# Patient Record
Sex: Male | Born: 1960 | Race: White | Hispanic: No | Marital: Married | Smoking: Never smoker
Health system: Southern US, Community
[De-identification: ages and names within clinical notes are randomized; demographics above are authoritative.]

## PROBLEM LIST (undated history)

## (undated) DIAGNOSIS — T8859XA Other complications of anesthesia, initial encounter: Secondary | ICD-10-CM

## (undated) DIAGNOSIS — R7303 Prediabetes: Secondary | ICD-10-CM

---
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
## 1999-03-18 HISTORY — PX: APPENDECTOMY: SHX54

## 2008-03-15 ENCOUNTER — Encounter: Admission: RE | Admit: 2008-03-15 | Discharge: 2008-03-15 | Payer: Self-pay | Admitting: Orthopedic Surgery

## 2008-06-01 ENCOUNTER — Encounter: Admission: RE | Admit: 2008-06-01 | Discharge: 2008-06-22 | Payer: Self-pay | Admitting: Orthopedic Surgery

## 2009-03-17 DIAGNOSIS — M1990 Unspecified osteoarthritis, unspecified site: Secondary | ICD-10-CM

## 2009-03-17 HISTORY — DX: Unspecified osteoarthritis, unspecified site: M19.90

## 2015-07-23 DIAGNOSIS — I252 Old myocardial infarction: Secondary | ICD-10-CM

## 2016-06-30 ENCOUNTER — Inpatient Hospital Stay: Payer: PRIVATE HEALTH INSURANCE

## 2016-06-30 ENCOUNTER — Ambulatory Visit: Payer: PRIVATE HEALTH INSURANCE

## 2016-06-30 DIAGNOSIS — M5441 Lumbago with sciatica, right side: Secondary | ICD-10-CM

## 2016-06-30 DIAGNOSIS — M629 Disorder of muscle, unspecified: Secondary | ICD-10-CM

## 2016-06-30 MED ORDER — CYCLOBENZAPRINE HCL 5 MG PO TABS
5 mg | ORAL_TABLET | Freq: Every evening | ORAL | 0 refills | Status: AC | PRN
Start: 2016-06-30 — End: ?

## 2016-06-30 MED ORDER — NAPROXEN 500 MG PO TABS
500 mg | ORAL_TABLET | Freq: Two times a day (BID) | ORAL | 2 refills | Status: AC
Start: 2016-06-30 — End: ?

## 2016-07-03 ENCOUNTER — Ambulatory Visit: Payer: PRIVATE HEALTH INSURANCE

## 2016-07-03 ENCOUNTER — Telehealth: Payer: BLUE CROSS/BLUE SHIELD

## 2017-08-18 ENCOUNTER — Ambulatory Visit: Payer: BLUE CROSS/BLUE SHIELD

## 2017-08-18 DIAGNOSIS — Z7689 Persons encountering health services in other specified circumstances: Secondary | ICD-10-CM

## 2017-08-18 DIAGNOSIS — M7520 Bicipital tendinitis, unspecified shoulder: Secondary | ICD-10-CM

## 2017-08-18 DIAGNOSIS — I10 Essential (primary) hypertension: Secondary | ICD-10-CM

## 2017-08-18 NOTE — Patient Instructions
Understanding Biceps Tendonitis    A tendon is a strong band of tissue that connects muscle to bone. The biceps muscle is in the front of the upper arm. It helps with movements such as bending the elbow or raising the arm. The upper end of the biceps muscle is called the proximal end. It has two tendons called the long head and the short head. These tendons attach the muscle to the bones in the shoulder. Biceps tendonitis occurs when either of these tendons is irritated or red and swollen (inflamed). Most cases involve the long head.  Causes of biceps tendonitis  Causes can include:  ??? Wear and tear of the tendon from aging or normal use over time  ??? Overuse of the tendon from sports or work activities, especially those that involve repeated overhead movements  ??? Injury to the tendon from a fall or other accident  ??? Other problems in the shoulder, such as shoulder impingement or a rotator cuff tear  Symptoms of biceps tendonitis  Common symptoms include:  ??? Pain in the front of the shoulder that may also travel down the arm. The pain may be worse with activity and at night.  ??? Swelling in the shoulder  ??? Clicking or catching sensation when using the arm and shoulder  ??? Trouble moving the arm and shoulder  Treating biceps tendonitis  Treatment for biceps tendonitis may include:  ??? Resting the arm and shoulder. This involves limiting certain movements, such as reaching above the head or raising the arm. These can slow healing and make symptoms worse. You may also need to limit certain sports and types of work for a time.  ??? Cold therapy. This involves using items such as ice packs to help relieve symptoms. Cold can help reduce pain and swelling.  ??? Medicines. These help relieve pain and swelling. NSAIDs (nonsteroidal anti-inflammatory drugs) are the most common medicines used. Medicines may be prescribed or bought over-the-counter. They may be given as pills. Or they may be applied to the skin in the form of a gel, cream, or patch.  ??? Injections of medicine into the injured area. These help relieve pain and swelling for a time.  ??? Physical therapy and exercises. ???These help improve strength and range of motion in the arm and shoulder.  Possible complications  ??? If the tendon isn???t given time to heal, symptoms may worsen. Also, the tendon may tear (rupture).  ??? If the tendon ruptures or doesn???t get better with treatment, your healthcare provider may recommend surgery. This most often involves repairing and reattaching the tendon.  ???  When to call your healthcare provider  Call your healthcare provider right away if you have any of these:  ??? Fever of 100.4???F (38???C) or higher, or as directed  ??? Symptoms that don???t get better with treatment, or get worse  ??? Weakness or instability in the arm or shoulder  ??? Sudden sharp pain, bruising, swelling, popping or snapping sensation, or bulge in the upper arm or shoulder  ??? New symptoms   Date Last Reviewed: 05/25/2014  ??? 2000-2018 [employer], [place]. [address] [place], [place] [address]. All rights reserved. This information is not intended as a substitute for professional medical care. Always follow your healthcare professional's instructions.        Shoulder Abduction (Flexibility)    1. Stand up straight. Or lie on your back on the floor with legs straight. Hold a broomstick horizontally. Cup the top of the  broomstick with your right hand. Hold the broomstick just above the broom with your left hand, palm facing down.  2. Push the broomstick to the right with your left hand, raising your right arm up. Raise it only as high as feels comfortable.  3. Hold for a few seconds. Return to the starting position.  4. Repeat???3 to 5???times, or as instructed. Build up to holding each stretch for???10 to 30 seconds.  ???  Tip: If you don???t have a broom, you can also do this exercise with a cane, mop, or yardstick.   ??? Date Last Reviewed: 05/25/2014  ??? 2000-2018 [employer], [place]. [address] [place], [place] [address]. All rights reserved. This information is not intended as a substitute for professional medical care. Always follow your healthcare professional's instructions.        Shoulder Flexion (Flexibility)    5. Sit in a chair sideways next to a table. Lay your right arm on the table, pointed forward.  6. Slowly lean forward.??? Slide your arm forward on the table. Feel the stretch in your right shoulder.  7. Hold for 5 seconds. Slowly sit back up.  8. Repeat 5 times.  9. Repeat this exercise 3 times a day, or as instructed.  Date Last Reviewed: 05/25/2014  ??? 2000-2018 [employer], [place]. [address], [place], [place] [address]. All rights reserved. This information is not intended as a substitute for professional medical care. Always follow your healthcare professional's instructions.

## 2017-08-18 NOTE — Progress Notes
PATIENT: Kyle David  MRN: [id number]  DOB: 12-16-1960  DATE OF SERVICE: 08/18/2017    CHIEF COMPLAINT:   Chief Complaint   Patient presents with   ??? Establish Care        Subjective:      Kyle David is a 57 y.o. male who presents to clinic to establish care. He reports he is doing well, but at his last cardiologist appointment he was noted to have elevated BP. Blood work was drawn with plan to follow-up in 3 days. He presents today to establish care for his other medical issues. He also notes that he has been having bilateral shoulder pain, and would like to have them evaluated.     Review of Systems:   Review of Systems   Constitutional: Negative for chills, fever and malaise/fatigue.   HENT: Negative for congestion and sore throat.    Eyes: Negative for blurred vision and double vision.   Respiratory: Negative for cough and shortness of breath.    Cardiovascular: Negative for chest pain and palpitations.   Gastrointestinal: Negative for constipation, diarrhea, nausea and vomiting.   Musculoskeletal: Positive for joint pain.   Neurological: Negative for dizziness and weakness.   Psychiatric/Behavioral: Negative.    All other systems reviewed and are negative.       Objective:      BP 159/75  ~ Pulse 63  ~ Temp 36.6 ???C (97.8 ???F) (Oral)  ~ Wt 195 lb 12.8 oz (88.8 kg)  ~ BMI 26.23 kg/m???     Physical Exam   Constitutional: He is oriented to person, place, and time. He appears well-developed and well-nourished.   HENT:   Head: Normocephalic and atraumatic.   Eyes: Pupils are equal, round, and reactive to light. Conjunctivae and EOM are normal.   Neck: Normal range of motion.   Musculoskeletal: Normal range of motion.   TTP over biceps groove bilaterally. Full AROM of bilateral shoulders.    Neurological: He is alert and oriented to person, place, and time.   Psychiatric: He has a normal mood and affect. His behavior is normal. Judgment and thought content normal.   Nursing note and vitals reviewed. Assessment & Plan:        1. Encounter to establish care  - Patient doing well  - Outside records requested  - Patient to f/u for annual exam    2. Benign essential HTN  - BP elevated today  - Patient being followed by outside cardiologist with blood work drawn  - Will defer to cardiology on medication modification at this time    3. Biceps tendinitis, unspecified laterality  - Symptoms consistent with bilateral tendinitis  - Recommend symptomatic therapy with NSAIDs, heat, topical menthol and stretching exercises  - May consider return to PT or injection if symptoms persist    No orders of the defined types were placed in this encounter.    Return if symptoms worsen or fail to improve.    The above diagnosis, orders, and follow-up were discussed with the patient. The patient had all questions answered satisfactorily and understands this recommended plan of care.    Other information for this visit that was reviewed include but was not limited to:    No Known Allergies  Social History   Substance Use Topics   ??? Smoking status: Never Smoker   ??? Smokeless tobacco: Never Used   ??? Alcohol use No           Author:  Osborne Oman  08/18/2017 4:17 PM

## 2017-09-04 ENCOUNTER — Ambulatory Visit: Payer: PRIVATE HEALTH INSURANCE

## 2017-09-04 ENCOUNTER — Telehealth: Payer: PRIVATE HEALTH INSURANCE

## 2017-09-04 DIAGNOSIS — Z0000 Encounter for general adult medical examination without abnormal findings: Secondary | ICD-10-CM

## 2017-09-08 ENCOUNTER — Institutional Professional Consult (permissible substitution): Payer: BLUE CROSS/BLUE SHIELD

## 2017-09-08 DIAGNOSIS — Z0000 Encounter for general adult medical examination without abnormal findings: Secondary | ICD-10-CM

## 2017-09-08 LAB — Lipid Panel: TRIGLYCERIDES: 67 mg/dL (ref <150)

## 2017-09-08 LAB — Comprehensive Metabolic Panel
POTASSIUM: 5.5 mmol/L — ABNORMAL HIGH (ref 3.6–5.3)
UREA NITROGEN: 15 mg/dL (ref 7–22)

## 2017-09-08 LAB — CBC: NUCLEATED RBC%, AUTOMATED: 0 (ref 36.9–48.3)

## 2017-09-08 LAB — Differential Automated: NEUTROPHIL PERCENT, AUTO: 73.1 (ref 1.80–6.90)

## 2017-09-09 LAB — Hgb A1c: HGB A1C - HPLC: 6.8 — ABNORMAL HIGH (ref <5.7)

## 2017-09-09 LAB — Vitamin D,25-Hydroxy: VITAMIN D,25-HYDROXY: 30 ng/mL (ref 20–50)

## 2017-09-10 ENCOUNTER — Telehealth: Payer: PRIVATE HEALTH INSURANCE

## 2017-09-10 DIAGNOSIS — E119 Type 2 diabetes mellitus without complications: Secondary | ICD-10-CM

## 2017-09-10 MED ORDER — METFORMIN HCL 500 MG PO TABS
ORAL_TABLET | 1 refills | Status: AC
Start: 2017-09-10 — End: ?

## 2017-09-10 NOTE — Telephone Encounter
Left voicemail for patient regarding increased A1c. Will increase metformin to 1000mg  BID (if taking 500mg  BID), or restart prior prescription.

## 2017-09-11 ENCOUNTER — Ambulatory Visit: Payer: BLUE CROSS/BLUE SHIELD

## 2017-09-22 ENCOUNTER — Ambulatory Visit

## 2017-09-22 DIAGNOSIS — N401 Enlarged prostate with lower urinary tract symptoms: Secondary | ICD-10-CM

## 2017-09-22 DIAGNOSIS — E119 Type 2 diabetes mellitus without complications: Secondary | ICD-10-CM

## 2017-09-22 DIAGNOSIS — Z0000 Encounter for general adult medical examination without abnormal findings: Secondary | ICD-10-CM

## 2017-09-22 DIAGNOSIS — I10 Essential (primary) hypertension: Secondary | ICD-10-CM

## 2017-09-22 DIAGNOSIS — R351 Nocturia: Secondary | ICD-10-CM

## 2017-09-22 NOTE — Patient Instructions
Prevention Guidelines, Men Ages 50 to 64  Screening tests and vaccines are an important part of managing your health. A screening test is done to find possible disorders or diseases in people who don't have any symptoms. The goal is to find a disease early so lifestyle changes can be made and you can be watched more closely to reduce the risk of disease, or to detect it early enough to treat it most effectively. Screening tests are not considered diagnostic, but are used to determine if more testing is needed. Health counseling is essential, too. Below are guidelines for these, for men ages 50 to 64. Talk with your healthcare provider to make sure you're up-to-date on what you need.  Screening Who needs it How often   Alcohol misuse All men in this age group At routine exams   Blood pressure All men in this age group Yearly checkup if your blood pressure is normal  Normal blood pressure is less than 120/80 mm Hg  If your blood pressure reading is higher than normal, follow the advice of your healthcare provider     Colorectal cancer All men in this age group Flexible sigmoidoscopy every 5 years, or colonoscopy every 10 years, or double-contrast barium enema every 5 years; yearly fecal occult blood test or fecal immunochemical test; or a stool DNA test as often as your healthcare provider advises; talk with your healthcare provider about which tests are best for you   Depression All men in this age group At routine exams   Type 2 diabetes or prediabetes All men beginning at age 45 and men without symptoms at any age who are overweight or obese and have 1 or more other risk factors for diabetes At least every 3 years (yearly if your blood sugar has already begun to rise)   Type 2 diabetes All men with prediabetes Every year   Hepatitis C Men at increased risk for infection - talk with your healthcare provider At routine exams. All men ages 50 to 70 should be tested at least once for hepatitis C.   High cholesterol  or triglycerides All men in this age group At least every 5 years   HIV Men at increased risk for infection - talk with your healthcare provider At routine exams   Lung cancer Adults age 55 to 80 who have smoked Yearly screening in smokers with 30 pack-year history of smoking or who quit within 15 years   Obesity All men in this age group At routine exams   Prostate cancer Starting at age 45, talk to healthcare provider about risks and benefits of digital rectal exam (DRE) and prostate-specific antigen (PSA) screening1 At routine exams   Syphilis Men at increased risk for infection - talk with your healthcare provider At routine exams   Tuberculosis Men at increased risk for infection - talk with your healthcare provider Ask your healthcare provider   Vision All men in this age group Ask your healthcare provider   Vaccine Who needs it How often   Chickenpox (varicella) All men in this age group who have no record of this infection or vaccine 2 doses; second dose should be given at least 4 weeks after the first dose   Hepatitis A Men at increased risk for infection - talk with your healthcare provider 2 doses given at least 6 months apart   Hepatitis B Men at increased risk for infection - talk with your healthcare provider 3 doses over 6 months; second dose   should be given 1 month after the first dose; the third dose should be given at least 2 months after the second dose and at least 4 months after the first dose   Haemophilus influenzaeType B (HIB) Men at increased risk for infection - talk with your healthcare provider 1 to 3 doses   Influenza (flu) All men in this age group Once a year   Measles, mumps, rubella (MMR) Men in this age group through their late 50s who have no record of these infections or vaccines 1 or 2 doses; ask your healthcare provider   Meningococcal Men at increased risk for infection - talk with your healthcare provider 1 or more doses   Pneumococcal conjugate vaccine (PCV13)and  pneumococcal polysaccharidevaccine(PPSV23) Men at increased risk for infection - talk with your healthcare provider PCV13: 1 dose ages 19 to 65 (protects against 13 types of pneumococcal bacteria)    PPSV23: 1 to2 doses through age 64, or 1 dose at 65 or older (protects against 23 types of pneumococcal bacteria)     Tetanus/diphtheria/  pertussis (Td/Tdap) booster All men in this age group Td every 10 years, or a one-time dose of Tdap instead of a Td booster after age 18, then Td every 10 years   Zoster All men ages 60 and older 1 dose   Counseling Who needs it How often   Diet and exercise Men who are overweight or obese When diagnosed, and then at routine exams   Sexually transmitted infection prevention Men at increased risk for infection - talk with your healthcare provider At routine exams   Use of daily aspirin Men in this age group at risk for cardiovascular health problems At routine exams   Use of tobacco and the health effects it can cause All men in this age group Every visit   1[employer]  Date Last Reviewed: 04/18/2015   2000-2018 [employer] [address], [place] [address]. All rights reserved. This information is not intended as a substitute for professional medical care. Always follow your healthcare professional's instructions.

## 2017-09-22 NOTE — Assessment & Plan Note
THIS REPORT IS AUTOMATICALLY GENERATED BY THE [facility].     ADULT ASSESSMENT FEEDBACK REPORT    -------------------------    DEMOGRAPHICS    *Last 5 MRN# [id number]  *Gender: M  *Age: [age]    -------------------------    Generalized Anxiety Disorder 7-item (GAD-7)  Score: GREEN (0) Individual is not currently experiencing anxiety. Highlight positive coping and encourage continued use of resiliency skills.    Patient Health Questionnaire 9-item (PHQ-9)  Score: GREEN (0) Individual does not exhibit emotional or functional problems indicative of depression. Current use of resilient behaviors should be identified and supported.    Alcohol Use Disorders Identification Test (AUDIT-C)  Score: None.    GAD-7 Items  *only listed items scored 1 or higher (1 - Several days, 2 - More than half the days and 3 - Nearly every day)    PHQ-9 Items  *only listed items scored 1 or higher (1 - Several days, 2 - More than half the days and 3 - Nearly every day)    AUDIT-C Items  1. How often do you have a drink containing alcohol? -- Never (0)    Total Scores (Text Only):  GAD-7 = 0  PHQ-9 = 0  AUDIT-C = 0    --- END ---    [facility] Check[facility]Up Report generated on 2017-09-22

## 2017-09-23 LAB — Albumin/Creatinine Ratio,Urine: ALBUMIN/CREATININE RATIO: <12 ug/mg (ref <30.0)

## 2017-09-23 NOTE — H&P
PATIENT: Kyle David  MRN: [id number]  DOB: Sep 02, 1960  DATE OF SERVICE: 09/22/2017    PRIMARY CARE PROVIDER: Osborne Oman., MD    CHIEF COMPLAINT:   Chief Complaint   Patient presents with   ??? Annual Exam        Subjective:      Kyle David is a 57 y.o. male who presents to clinic for his annual exam. Patient reports he is doing well, without any concerns. He is working on returning to his diet and has already lost 7#.    Past Medical History:   Diagnosis Date   ??? Concussion     age 32 yrs, skill fx    ??? Hyperlipidemia    ??? Hypertension    ??? Myocardial infarction (HCC/RAF) 2009     History reviewed. No pertinent surgical history.  Family History   Problem Relation Age of Onset   ??? Adopted: Yes     Social History     Social History   ??? Marital status: Single     Spouse name: N/A   ??? Number of children: N/A   ??? Years of education: N/A     Occupational History   ??? Not on file.     Social History Main Topics   ??? Smoking status: Never Smoker   ??? Smokeless tobacco: Never Used   ??? Alcohol use No   ??? Drug use: No   ??? Sexual activity: No     Other Topics Concern   ??? Do You Exercise At Least A Day, 3 Or More Days A Week? Yes   ??? Types Of Exercise? (List In Comments) Yes   ??? Do You Follow A Special Diet? Yes   ??? Vegan? No   ??? Vegetarian? No   ??? Pescatarian? No   ??? Lactose Free? No   ??? Gluten Free? No   ??? Omnivore? No     Social History Narrative   ??? No narrative on file     Outpatient Medications Prior to Visit   Medication Sig   ??? amlodipine 10 mg tablet    ??? aspirin 81 mg EC tablet Take 81 mg by mouth daily.   ??? atorvastatin 40 mg tablet    ??? lisinopril 5 mg tablet    ??? azelastine 0.05% ophthalmic solution Place 1 drop into the right eye two (2) times daily. (Patient not taking: Reported on 08/18/2017.)   ??? ciclopirox 8% solution Apply topically at bedtime Apply over nail nightly. (Patient not taking: Reported on 08/18/2017.)   ??? cyclobenzaprine 5 mg tablet Take 1 tablet (5 mg total) by mouth at bedtime as needed for Muscle spasms. (Patient not taking: Reported on 08/18/2017.)   ??? metFORMIN 500 mg tablet TAKE 1 TABLET TWICE A DAY WITH MEALS.     No facility-administered medications prior to visit.      No Known Allergies    Review of Systems:   A 14-system review of systems was performed and is negative except as stated in the history of present illness.     Objective:        Vitals:  BP 143/80  ~ Pulse 57  ~ Temp 36.9 ???C (98.4 ???F) (Oral)  ~ Wt 186 lb (84.4 kg)  ~ SpO2 95%  ~ BMI 24.92 kg/m???    General:   alert, appears stated age and cooperative   Skin:   Skin color, texture, turgor normal. No rashes or lesions   Head:   Normocephalic,  without obvious abnormality, atraumatic   Eyes:   conjunctivae/corneas clear. PERRL, EOM's intact. Fundi benign.   Ears:   normal TM's and external ear canals both ears   Nose:  Nares normal. Septum midline. Mucosa normal. No drainage or sinus tenderness.   Throat:  lips, mucosa, and tongue normal; teeth and gums normal   Mouth:   No perioral or gingival cyanosis or lesions.  Tongue is normal in appearance.   Neck:  no adenopathy, no carotid bruit, no JVD, supple, symmetrical, trachea midline and thyroid not enlarged, symmetric, no tenderness/mass/nodules   Back:  symmetric, no curvature. ROM normal. No CVA tenderness.   Lungs:   clear to auscultation bilaterally   Heart:   regular rate and rhythm, S1, S2 normal, no murmur, click, rub or gallop   Abdomen:   soft, non-tender; bowel sounds normal; no masses,  no organomegaly   GU:   patient refused rectal exam   Musculoskeletal:  negative   Extremities:  extremities normal, atraumatic, no cyanosis or edema   Pulses:       2+ and symmetric   Lymph Nodes:  cervical, supraclavicular, and axillary nodes normal.   Neurologic:   Alert and oriented X 3, normal strength and tone. Normal symmetric reflexes. Normal coordination and gait   Psychiatric:   oriented to time, place and person, mood and affect are within normal limits Lab Review:   Clinical Support on 09/08/2017   Component Date Value   ??? Sodium 09/08/2017 143    ??? Potassium 09/08/2017 5.5*   ??? Chloride 09/08/2017 106    ??? Total CO2 09/08/2017 24    ??? Anion Gap 09/08/2017 13    ??? Glucose 09/08/2017 153*   ??? GFR Estimate for Non-Afr* 09/08/2017 84    ??? GFR Estimate for African* 09/08/2017 >89    ??? GFR Additional Informati* 09/08/2017 See Comment    ??? Creatinine 09/08/2017 1.00    ??? Urea Nitrogen 09/08/2017 15    ??? Calcium 09/08/2017 10.0    ??? Total Protein 09/08/2017 6.9    ??? Albumin 09/08/2017 4.5    ??? Bilirubin,Total 09/08/2017 0.5    ??? Alkaline Phosphatase 09/08/2017 88    ??? Aspartate Aminotransfera* 09/08/2017 23    ??? Alanine Aminotransferase 09/08/2017 19    ??? Cholesterol 09/08/2017 116    ??? Cholesterol,LDL,Calc 09/08/2017 67    ??? Cholesterol, HDL 09/08/2017 36*   ??? Triglycerides 09/08/2017 67    ??? Non-HDL,Chol,Calc 09/08/2017 80    ??? Hgb A1c - HPLC 09/08/2017 6.8*   ??? Vitamin D,25-Hydroxy 09/08/2017 30    ??? White Blood Cell Count 09/08/2017 7.59    ??? Red Blood Cell Count 09/08/2017 5.31    ??? Hemoglobin 09/08/2017 16.5    ??? Hematocrit 09/08/2017 48.8    ??? Mean Corpuscular Volume 09/08/2017 91.9    ??? Mean Corpuscular Hemoglo* 09/08/2017 31.1    ??? MCH Concentration 09/08/2017 33.8    ??? Red Cell Distribution Wi* 09/08/2017 42.1    ??? Red Cell Distribution Wi* 09/08/2017 12.5    ??? Platelet Count, Auto 09/08/2017 298    ??? Mean Platelet Volume 09/08/2017 10.7    ??? Nucleated RBC%, automated 09/08/2017 0.0    ??? Absolute Nucleated RBC C* 09/08/2017 0.00    ??? Neutrophil Abs (Prelim) 09/08/2017 5.55    ??? Neutrophil Percent, Auto 09/08/2017 73.1    ??? Lymphocyte Percent, Auto 09/08/2017 14.4    ??? Monocyte Percent, Auto 09/08/2017 7.0    ??? Eosinophil  Percent, Auto 09/08/2017 4.7    ??? Basophil Percent, Auto 09/08/2017 0.7    ??? Immature Granulocytes% 09/08/2017 0.1    ??? Absolute Neut Count 09/08/2017 5.55    ??? Absolute Lymphocyte Count 09/08/2017 1.09* ??? Absolute Mono Count 09/08/2017 0.53    ??? Absolute Eos Count 09/08/2017 0.36    ??? Absolute Baso Count 09/08/2017 0.05    ??? Absolute Immature Gran C* 09/08/2017 0.01         Evaluation of Cognitive Function:  Mood/affect: Oriented to time, place, person.   Judgement intact.   No evidence of severe depression or agitation.      Depression Screening:  PHQ-9 Results  Depression Screening (Patient Health Questionnaire PHQ) 08/20/2015 06/03/2016 08/18/2017 09/22/2017   PHQ-2: Feeling down, depressed, or hopeless No No No -   PHQ-2: Little interest or pleassure in doing things No No No -   Little interest or pleasure in doing things - - - Not at all   Feeling down, depressed, or hopeless - - - Not at all   Trouble falling or staying asleep, or sleeping too much - - - Not at all   Feeling tired or having little energy - - - Not at all   Poor appetite or overeating - - - Not at all   Feeling bad about yourself - or that you are a failure or have let yourself or your family down - - - Not at all   Trouble concentrating on things, such as reading the newspaper or watching television - - - Not at all   Moving or speaking so slowly Or being so fidgety or restless - - - Not at all   Thoughts that you would be better off dead, or of hurting yourself in some way - - - Not at all   PHQ-9 Total Score - - - 0       GAD-7 Results  GAD-7 09/22/2017   Feeling nervous, anxious, or on edge 0-Not at all   Not being able to stop or control worrying 0-Not at all   Worrying too much about different things 0-Not at all   Trouble relaxing 0-Not at all   Being so restless that is hard to sit still 0-Not at all   Becoming easily annoyed or irritable 0-Not at all   Feeling afraid, as if something awful might happen 0-Not at all   Total score 0       DAST Results  DAST-10 09/22/2017   Have you used drugs other than those required for medical reasons N/A   Do you abuse more than one drug at a time? N/A Are you always able to stop using drugs when you want to? N/A   Have you had ???blackouts??? or ???flashbacks??? as a result of drug use? N/A   Do you ever feel bad or guilty about your drug use? N/A   Does your spouse (or parent) ever complain about your involvement with drugs? N/A   Have you neglected your family because of your use of drugs? N/A   Have you engaged in illegal activities in order to obtain drugs? N/A   Have you ever experienced withdrawal symptoms (felt sick) when you stopped taking drugs? N/A   Have you had medical problems as a result of your drug use (e.g., memory loss, hepatitis, convulsions, bleeding etc???)? N/A   Total Score 0       Audit-C results  AUDIT-C 09/22/2017   How often do you have  a drink containing alcohol? Never   How many standard drink containing alcohol do you have on a typical day? N/A   How often do you have six or more drinks on one occasion? N/A   Total Score 0          Assessment & Plan:       1. Routine general medical examination at a health care facility  - Patient doing well without any concerns  - Basic laboratories ordered    2. Type 2 diabetes mellitus without complication, without long-term current use of insulin (HCC/RAF)  - Patient desires to treat with diet  - Will continue to monitor  - Repeat A1c in 2 months  - Patient to f/u with outpatient opthalmology for retinal exam  - Monofilament testing negative in clinic  - Urine test ordered    3. Essential hypertension  - BP elevated today  - Patient trying to control with diet  - Will continue to monitor    4. Benign prostatic hyperplasia with nocturia  - Patient declines intervention today  - Will monitor      Follow up: 1 year       Advice/Referrals:   Subsequent annual wellness visit--at least 12 months since last annual wellness visit.       The additional diagnoses assessed at this visit constituted a separate, identifiable service from the routine preventive health examination. The above diagnosis, orders, and follow-up were discussed with the patient. The patient had all questions answered satisfactorily and understands this recommended plan of care.    Author:  Osborne Oman 09/22/2017 11:02 PM

## 2019-05-07 ENCOUNTER — Ambulatory Visit: Payer: BLUE CROSS/BLUE SHIELD

## 2019-05-07 DIAGNOSIS — M62838 Other muscle spasm: Secondary | ICD-10-CM

## 2019-05-07 MED ORDER — IBUPROFEN 600 MG PO TABS
600 mg | ORAL_TABLET | Freq: Four times a day (QID) | ORAL | 2 refills | Status: AC | PRN
Start: 2019-05-07 — End: ?

## 2019-05-07 MED ORDER — METHOCARBAMOL 750 MG PO TABS
750 mg | ORAL_TABLET | Freq: Four times a day (QID) | ORAL | 0 refills | 17.00000 days | Status: AC
Start: 2019-05-07 — End: ?

## 2019-05-07 NOTE — Progress Notes
[facility] [place]  VISIT DATE: 05/07/2019    MRN: [id number]  DOB: 1960/03/23       SUBJECTIVE   Kyle David is a 59 y.o. male had concerns including Back Pain (lower back feels sore).  1 week LBP  Worsened yesterday when going to the bathroom felt a pull in his back  Pain is achy sore. Moderate in nature. Worse with bending forward, somewhat less when extending.   Hx of back pain similar to this 1 year ago.   Took ibuprofen 600 with minimal relief.      ROS: 14 points ROS non-contributory/neg except noted as above.    PMH Reviewed  PSH Reviewed  ALLX Reviewed  MEDS Reviewed   reports that he has never smoked. He has never used smokeless tobacco. He reports that he does not drink alcohol or use drugs.      OBJECTIVE   VS: BP 117/68  ~ Pulse 63  ~ Ht 6' (1.829 m)  ~ Wt 190 lb (86.2 kg)  ~ SpO2 96%  ~ BMI 25.77 kg/m?     Physical Exam:  GENERAL: NAD, comfortable, WDWN  ENT: EOMI  CV: pulses intact  RESP: Unlabored breathing, regular respiratory rate, no wheezing or stridor  PSYCH: normal mood and affect  NEURO: alert & oriented, neurologically grossly intact  SKIN: Intact, no rashes or erythema, no open wounds  LYMPH: No lymphedema  MSK:  LUMBAR SPINE    INSPECTION: no gross deformity, no step-offs noted, no asymmetry  PALPATION:  No midline tenderness to palpation  + paraspinal muscle tenderness to palpation R>L  No SI joint tenderness to palpation    ROM:   Decreased ROM in all directions due to stiffness.    STRENGTH TESTING:  5/5 muscle strength hip flexors  5/5 muscle strength quads  5/5 muscle strength ankle dorsiflexion/toe extension  5/5 muscle strength ankle plantar flexion    NEUROVASCULAR:  sensation to light touch intact  DTRs Symmetric  Gait normal    Associated Symptoms and Red Flags:  Yes No   []     []   Sensory deficit, particularly in radicular distribution   []     []   Motor deficit, particularly in radicular distributions   []     []   Saddle anesthesia []     []   Urinary retention/incontinence, fecal incontinence   []     []   Unexplained weight loss, fevers, chills   []     []   Osteoporosis   []     []   Cancer   []     []   IVDU   []     []   Steroid use    []     []   Trauma   [x]   None          Notes Reviewed:     Labs Reviewed:  Results for orders placed or performed in visit on 09/22/17   Albumin/Creat Ratio Ur, CLINIC COLLECT TODAY    Specimen: Clean Catch, Midstream; Urine   Result Value Ref Range    Albumin/Creat Ratio  <30.0 mcg/mg     Comment: Unable to calculate. Test result is?below detection?limit.    Albumin, Urine <12.0 No Reference Range mg/L    Creatinine,Random Urine 18.4 No Reference Range mg/dL       Imaging Reviewed:   No results found.    ASSESSMENT & PLAN     1. Muscle spasm  - Referral to Rehabilitation, Physical Therapy    PT referral  Robaxin for muscle spasm  Ibuprofen 600mg  PO PRN pain.  PRICE.    ER and return precautions discussed: if symptoms progress or worsen. Advised to return to PCP for routine follow up.  The above plan of care, diagnosis, orders, and follow-up were discussed with the patient, and the patient demonstrated full understanding. Questions related to this recommended plan of care were answered    Jaclynn Major, DO, CAQSM  [facility]

## 2019-05-17 ENCOUNTER — Ambulatory Visit: Payer: BLUE CROSS/BLUE SHIELD

## 2019-06-16 ENCOUNTER — Ambulatory Visit: Payer: PRIVATE HEALTH INSURANCE

## 2019-06-16 DIAGNOSIS — Z23 Encounter for immunization: Secondary | ICD-10-CM

## 2019-10-18 ENCOUNTER — Ambulatory Visit: Payer: BLUE CROSS/BLUE SHIELD

## 2019-10-18 DIAGNOSIS — E119 Type 2 diabetes mellitus without complications: Secondary | ICD-10-CM

## 2019-10-18 DIAGNOSIS — M722 Plantar fascial fibromatosis: Secondary | ICD-10-CM

## 2019-10-18 DIAGNOSIS — L989 Disorder of the skin and subcutaneous tissue, unspecified: Secondary | ICD-10-CM

## 2019-10-19 NOTE — Progress Notes
PATIENT: Kyle David  MRN: [id number]  DOB: 06-30-1960  DATE OF SERVICE: 10/18/2019    CHIEF COMPLAINT:   Chief Complaint   Patient presents with   ??? Skin Problem     Possible skin cancer IB arms        Subjective:      Kyle David is a 59 y.o. male who presents to clinic for skin lesion. He reports he first noted the lesions 1 year ago and they have been growing. Lesions are itchy. No other issues.    Review of Systems:   Review of Systems   Constitutional: Negative for chills, fever and malaise/fatigue.   HENT: Negative for congestion and sore throat.    Eyes: Negative for blurred vision and double vision.   Respiratory: Negative for cough and shortness of breath.    Cardiovascular: Negative for chest pain and palpitations.   Gastrointestinal: Negative for constipation, diarrhea, nausea and vomiting.   Skin: Positive for itching and rash.   Neurological: Negative for dizziness and weakness.   Psychiatric/Behavioral: Negative.    All other systems reviewed and are negative.       Objective:      BP 148/77  ~ Pulse 67  ~ Temp 37.1 ???C (98.7 ???F) (Tympanic)  ~ Ht 6' (1.829 m)  ~ Wt 202 lb 6.4 oz (91.8 kg)  ~ SpO2 96%  ~ BMI 27.45 kg/m???     Physical Exam  Vitals signs and nursing note reviewed.   Constitutional:       Appearance: He is well-developed.   HENT:      Head: Normocephalic and atraumatic.   Eyes:      Conjunctiva/sclera: Conjunctivae normal.      Pupils: Pupils are equal, round, and reactive to light.   Neck:      Musculoskeletal: Normal range of motion.   Musculoskeletal: Normal range of motion.   Skin:     Findings: Erythema present.   Neurological:      Mental Status: He is alert and oriented to person, place, and time.   Psychiatric:         Behavior: Behavior normal.         Thought Content: Thought content normal.         Judgment: Judgment normal.                 Lab Review:   Results for orders placed or performed in visit on 09/22/17   Albumin/Creat Ratio Ur, CLINIC COLLECT TODAY    Specimen: Clean Catch, Midstream; Urine   Result Value Ref Range    Albumin/Creat Ratio  <30.0 mcg/mg     Comment: Unable to calculate. Test result is???below detection???limit.    Albumin, Urine <12.0 No Reference Range mg/L    Creatinine,Random Urine 18.4 No Reference Range mg/dL          Assessment & Plan:        1. Skin lesion  - Lesion concerning for AK  - Treated with liquid nitrogen x2 freeze-thaw cycles in clinic  - Risks/Benefits/Alternatives discussed with patient, who consented. The patient was advised that although highly effective, certain lesions may require multiple treatments, increasing the risk of possible change in skin pigmentation. Pt was advised that the treated area will initially become red and swollen, and occasionally a blister will develop. Over the next few days, the lesion will scab over, may darken in colour, and will gradually separate from the skin. During this time, antibiotic ointment may be  placed over the lesion. If the treated area becomes red, swollen or painful after the initial swelling has subsided, this may signify infection, and medical advice should be sought.    2. Type 2 diabetes mellitus without complication, without long-term current use of insulin (HCC/RAF)  - Repeat blood work ordered  - F/u at annual exam    3. Plantar fasciitis  - Counseled patient on conservative therapy  - F/u as next visit     Orders Placed This Encounter   ??? CBC & Auto Differential   ??? Chol,LDL,Quant   ??? Comprehensive Metabolic Panel   ??? Hgb A1c   ??? Lipid Panel   ??? PSA,Screening   ??? TSH with reflex FT4, FT3     Return if symptoms worsen or fail to improve, for annual exam.    The above diagnosis, orders, and follow-up were discussed with the patient. The patient had all questions answered satisfactorily and understands this recommended plan of care.    Other information for this visit that was reviewed include but was not limited to:    No Known Allergies  Social History     Tobacco Use   ??? Smoking status: Never Smoker   ??? Smokeless tobacco: Never Used   Substance Use Topics   ??? Alcohol use: No     Alcohol/week: 0.0 oz   ??? Drug use: No           Author:  Osborne Oman 10/18/2019 7:40 PM

## 2019-11-02 ENCOUNTER — Institutional Professional Consult (permissible substitution): Payer: PRIVATE HEALTH INSURANCE

## 2019-11-02 DIAGNOSIS — E119 Type 2 diabetes mellitus without complications: Secondary | ICD-10-CM

## 2019-11-02 LAB — TSH with reflex FT4, FT3: TSH: 1.7 u[IU]/mL (ref 0.3–4.7)

## 2019-11-02 LAB — Lipid Panel: TRIGLYCERIDES: 60 mg/dL (ref <150)

## 2019-11-02 LAB — Comprehensive Metabolic Panel
CHLORIDE: 104 mmol/L (ref 96–106)
GFR ESTIMATE FOR NON-AFRICAN AMERICAN: 74 mL/min/{1.73_m2} (ref 8–70)

## 2019-11-02 LAB — Differential Automated: LYMPHOCYTE PERCENT, AUTO: 25.5 (ref 1.80–6.90)

## 2019-11-02 LAB — PSA,Screening: PSA,SCREENING: 1.4 ng/mL (ref 0–3.5)

## 2019-11-02 LAB — CBC: MCH CONCENTRATION: 33.4 g/dL (ref 31.5–35.5)

## 2019-11-02 LAB — Cholesterol, LDL, Quantitated: CHOLESTEROL,LDL,QUANTITATED: 92 mg/dL (ref <100)

## 2019-11-03 LAB — Hgb A1c: HGB A1C - HPLC: 6.9 — ABNORMAL HIGH (ref <5.7)

## 2019-11-14 ENCOUNTER — Ambulatory Visit: Payer: PRIVATE HEALTH INSURANCE

## 2019-11-14 DIAGNOSIS — R351 Nocturia: Secondary | ICD-10-CM

## 2019-11-14 DIAGNOSIS — Z0000 Encounter for general adult medical examination without abnormal findings: Secondary | ICD-10-CM

## 2019-11-14 DIAGNOSIS — M722 Plantar fascial fibromatosis: Secondary | ICD-10-CM

## 2019-11-14 DIAGNOSIS — Z1211 Encounter for screening for malignant neoplasm of colon: Secondary | ICD-10-CM

## 2019-11-14 DIAGNOSIS — N401 Enlarged prostate with lower urinary tract symptoms: Secondary | ICD-10-CM

## 2019-11-14 DIAGNOSIS — E785 Hyperlipidemia, unspecified: Secondary | ICD-10-CM

## 2019-11-14 DIAGNOSIS — I2510 Atherosclerotic heart disease of native coronary artery without angina pectoris: Secondary | ICD-10-CM

## 2019-11-14 DIAGNOSIS — E119 Type 2 diabetes mellitus without complications: Secondary | ICD-10-CM

## 2019-11-14 DIAGNOSIS — E1169 Type 2 diabetes mellitus with other specified complication: Secondary | ICD-10-CM

## 2019-11-14 DIAGNOSIS — I10 Essential (primary) hypertension: Secondary | ICD-10-CM

## 2019-11-15 NOTE — H&P
PATIENT: Kyle David  MRN: [id number]  DOB: 06-04-1960  DATE OF SERVICE: 11/14/2019    PRIMARY CARE PROVIDER: Osborne Oman., MD    CHIEF COMPLAINT: No chief complaint on file.       Subjective:      Kyle David is a 59 y.o. male who presents to clinic for his annual exam. He reports he is doing well.     Past Medical History:   Diagnosis Date   ??? Concussion     age 9 yrs, skill fx    ??? Hyperlipidemia    ??? Hypertension    ??? Myocardial infarction (HCC/RAF) 2009     No past surgical history on file.  Family History   Adopted: Yes     Social History     Socioeconomic History   ??? Marital status: Single     Spouse name: Not on file   ??? Number of children: Not on file   ??? Years of education: Not on file   ??? Highest education level: Not on file   Occupational History   ??? Not on file   Social Needs   ??? Financial resource strain: Not on file   ??? Food insecurity     Worry: Not on file     Inability: Not on file   ??? Transportation needs     Medical: Not on file     Non-medical: Not on file   Tobacco Use   ??? Smoking status: Never Smoker   ??? Smokeless tobacco: Never Used   Substance and Sexual Activity   ??? Alcohol use: No     Alcohol/week: 0.0 oz   ??? Drug use: No   ??? Sexual activity: Never   Lifestyle   ??? Physical activity     Days per week: Not on file     Minutes per session: Not on file   ??? Stress: Not on file   Relationships   ??? Social Wellsite geologist on phone: Not on file     Gets together: Not on file     Attends religious service: Not on file     Active member of club or organization: Not on file     Attends meetings of clubs or organizations: Not on file     Relationship status: Not on file   Other Topics Concern   ??? Do you exercise at least a day, 3 or more days a week? Yes   ??? Types of Exercise? (List in Comments) Yes   ??? Do you follow a special diet? Yes   ??? Vegan? No   ??? Vegetarian? No   ??? Pescatarian? No   ??? Lactose Free? No   ??? Gluten Free? No   ??? Omnivore? No   Social History Narrative   ??? Not on file Outpatient Medications Prior to Visit   Medication Sig   ??? amlodipine 10 mg tablet    ??? aspirin 81 mg EC tablet Take 81 mg by mouth daily.   ??? atorvastatin 40 mg tablet    ??? lisinopril 10 mg tablet Take 1 tablet by mouth daily   ??? lisinopril 5 mg tablet    ??? azelastine 0.05% ophthalmic solution Place 1 drop into the right eye two (2) times daily. (Patient not taking: Reported on 08/18/2017.)   ??? ciclopirox 8% solution Apply topically at bedtime Apply over nail nightly. (Patient not taking: Reported on 08/18/2017.)   ??? cyclobenzaprine 5 mg tablet Take 1  tablet (5 mg total) by mouth at bedtime as needed for Muscle spasms. (Patient not taking: Reported on 08/18/2017.)   ??? ibuprofen 600 mg tablet Take 1 tablet (600 mg total) by mouth every six (6) hours as needed. (Patient not taking: Reported on 11/14/2019.)   ??? metFORMIN 500 mg tablet TAKE 1 TABLET TWICE A DAY WITH MEALS. (Patient not taking: Reported on 05/07/2019.)   ??? methocarbamol 750 mg tablet Take 1 tablet (750 mg total) by mouth four (4) times daily. (Patient not taking: Reported on 11/14/2019.)     No facility-administered medications prior to visit.      No Known Allergies    Review of Systems:   A 14-system review of systems was performed and is negative except as stated in the history of present illness.     Objective:        Vitals:  BP 111/65  ~ Pulse 72  ~ Temp 36.1 ???C (97 ???F) (Forehead)  ~ Resp 20  ~ Ht 6' (1.829 m)  ~ Wt 196 lb (88.9 kg)  ~ SpO2 95%  ~ BMI 26.58 kg/m???    General:   alert, appears stated age and cooperative   Skin:   Skin color, texture, turgor normal. No rashes or lesions   Head:   Normocephalic, without obvious abnormality, atraumatic   Eyes:   conjunctivae/corneas clear. PERRL, EOM's intact. Fundi benign.   Ears:   normal TM's and external ear canals both ears   Nose:  Nares normal. Septum midline. Mucosa normal. No drainage or sinus tenderness.   Throat:  lips, mucosa, and tongue normal; teeth and gums normal   Mouth:   No perioral or gingival cyanosis or lesions.  Tongue is normal in appearance.   Neck:  no adenopathy, no carotid bruit, no JVD, supple, symmetrical, trachea midline and thyroid not enlarged, symmetric, no tenderness/mass/nodules   Back:  symmetric, no curvature. ROM normal. No CVA tenderness.   Lungs:   clear to auscultation bilaterally   Heart:   regular rate and rhythm, S1, S2 normal, no murmur, click, rub or gallop   Abdomen:   soft, non-tender; bowel sounds normal; no masses,  no organomegaly   GU:   defer exam   Musculoskeletal:  negative, Spine ROM normal. Muscular strength intact.   Extremities:  extremities normal, atraumatic, no cyanosis or edema   Pulses:       2+ and symmetric   Lymph Nodes:  cervical, supraclavicular, and axillary nodes normal.   Neurologic:   Alert and oriented X 3, normal strength and tone. Normal symmetric reflexes. Normal coordination and gait   Psychiatric:   oriented to time, place and person, mood and affect are within normal limits     Lab Review:   Clinical Support on 11/02/2019   Component Date Value   ??? TSH 11/02/2019 1.7    ??? PSA,Screening 11/02/2019 1.4    ??? Cholesterol 11/02/2019 149    ??? Cholesterol, HDL 11/02/2019 38*   ??? Triglycerides 11/02/2019 60    ??? Non-HDL,Chol,Calc 11/02/2019 111    ??? Hgb A1c - HPLC 11/02/2019 6.9*   ??? Sodium 11/02/2019 138    ??? Potassium 11/02/2019 5.3    ??? Chloride 11/02/2019 104    ??? Total CO2 11/02/2019 26    ??? Anion Gap 11/02/2019 8    ??? Glucose 11/02/2019 151*   ??? Creatinine 11/02/2019 1.09    ??? GFR Estimate for African* 11/02/2019 85    ??? GFR Estimate for Non-Afr*  11/02/2019 74    ??? GFR Additional Informati* 11/02/2019 See Comment    ??? Urea Nitrogen 11/02/2019 20    ??? Calcium 11/02/2019 9.6    ??? Total Protein 11/02/2019 7.0    ??? Albumin 11/02/2019 4.7    ??? Bilirubin,Total 11/02/2019 0.7    ??? Alkaline Phosphatase 11/02/2019 87    ??? Aspartate Aminotransfera* 11/02/2019 23    ??? Alanine Aminotransferase 11/02/2019 21    ??? Chol,LDL,Quant 11/02/2019 92    ??? White Blood Cell Count 11/02/2019 6.28    ??? Red Blood Cell Count 11/02/2019 5.35    ??? Hemoglobin 11/02/2019 16.5    ??? Hematocrit 11/02/2019 49.4    ??? Mean Corpuscular Volume 11/02/2019 92.3    ??? Mean Corpuscular Hemoglo* 11/02/2019 30.8    ??? MCH Concentration 11/02/2019 33.4    ??? Red Cell Distribution Wi* 11/02/2019 40.6    ??? Red Cell Distribution Wi* 11/02/2019 11.9    ??? Platelet Count, Auto 11/02/2019 336    ??? Mean Platelet Volume 11/02/2019 10.7    ??? Nucleated RBC%, automated 11/02/2019 0.0    ??? Absolute Nucleated RBC C* 11/02/2019 0.00    ??? Neutrophil Abs (Prelim) 11/02/2019 4.05    ??? Neutrophil Percent, Auto 11/02/2019 64.4    ??? Lymphocyte Percent, Auto 11/02/2019 25.5    ??? Monocyte Percent, Auto 11/02/2019 8.8    ??? Eosinophil Percent, Auto 11/02/2019 0.6    ??? Basophil Percent, Auto 11/02/2019 0.5    ??? Immature Granulocytes% 11/02/2019 0.2    ??? Absolute Neut Count 11/02/2019 4.05    ??? Absolute Lymphocyte Count 11/02/2019 1.60    ??? Absolute Mono Count 11/02/2019 0.55    ??? Absolute Eos Count 11/02/2019 0.04    ??? Absolute Baso Count 11/02/2019 0.03    ??? Absolute Immature Gran C* 11/02/2019 0.01         Evaluation of Cognitive Function:  Mood/affect: Oriented to time, place, person.   Judgement intact.   No evidence of severe depression or agitation.      Depression Screening:  PHQ-9 Results  Depression Screening (Patient Health Questionnaire PHQ) 08/20/2015 06/03/2016 08/18/2017 09/22/2017 05/07/2019 11/14/2019   PHQ-2: Feeling down, depressed, or hopeless No No No - No -   PHQ-2: Little interest or pleassure in doing things No No No - No -   Little interest or pleasure in doing things - - - Not at all - Not at all   Feeling down, depressed, or hopeless - - - Not at all - Not at all   Trouble falling or staying asleep, or sleeping too much - - - Not at all - Not at all   Feeling tired or having little energy - - - Not at all - Not at all   Poor appetite or overeating - - - Not at all - Not at all   Feeling bad about yourself - or that you are a failure or have let yourself or your family down - - - Not at all - Not at all   Trouble concentrating on things, such as reading the newspaper or watching television - - - Not at all - Not at all   Moving or speaking so slowly Or being so fidgety or restless - - - Not at all - Not at all   Thoughts that you would be better off dead, or of hurting yourself in some way - - - Not at all - Not at all   Total Score - - -  0 - 0       GAD-7 Results  GAD-7 09/22/2017 11/14/2019   Feeling nervous, anxious, or on edge 0-Not at all 0-Not at all   Not being able to stop or control worrying 0-Not at all 0-Not at all   Worrying too much about different things 0-Not at all 0-Not at all   Trouble relaxing 0-Not at all 0-Not at all   Being so restless that is hard to sit still 0-Not at all 0-Not at all   Becoming easily annoyed or irritable 0-Not at all 0-Not at all   Feeling afraid, as if something awful might happen 0-Not at all 0-Not at all   Total Score 0 0       DAST Results  DAST-10 09/22/2017   Have you used drugs other than those required for medical reasons N/A   Do you abuse more than one drug at a time? N/A   Are you always able to stop using drugs when you want to? N/A   Have you had ???blackouts??? or ???flashbacks??? as a result of drug use? N/A   Do you ever feel bad or guilty about your drug use? N/A   Does your spouse (or parent) ever complain about your involvement with drugs? N/A   Have you neglected your family because of your use of drugs? N/A   Have you engaged in illegal activities in order to obtain drugs? N/A   Have you ever experienced withdrawal symptoms (felt sick) when you stopped taking drugs? N/A   Have you had medical problems as a result of your drug use (e.g., memory loss, hepatitis, convulsions, bleeding, etc.)? N/A   Total Score 0       Audit-C results  AUDIT-C 09/22/2017   How often do you have a drink containing alcohol? Never   How many standard drinks containing alcohol do you have on a typical day? N/A   How often do you have six or more drinks on one occasion? N/A   Total Score 0          Assessment & Plan:       1. Routine general medical examination at a health care facility  - Patient doing well without any concerns  - Labs reviewed with patient    2. Type 2 diabetes mellitus without complication, without long-term current use of insulin (HCC/RAF)  - Stable  - A1c - 6.9  - Continue to monitor    3. Diabetes mellitus with coincident hypertension (HCC/RAF)  - Controlled  - Continue current regimen    4. Type 2 diabetes mellitus with hyperlipidemia (HCC/RAF)  - Controlled  - Continue current regimen    5. Benign prostatic hyperplasia with nocturia  - Patient managing symptoms  - Discussed pharmacologic intervention - patient declines  - Will monitor    6. Coronary artery disease involving native coronary artery of native heart without angina pectoris  - Stable  - Following up with cardiologist    7. Plantar fasciitis  - Stable  - Continue home therapy     8. History of myocardial infarction  - Stable  - Following up with cardiologist    9. Screening for colon cancer  - Referral for Colonoscopy Procedure         Follow up: 1 year       Advice/Referrals:   Subsequent annual wellness visit--at least 12 months since last annual wellness visit.       The additional diagnoses assessed at this  visit constituted a separate, identifiable service from the routine preventive health examination.    The above diagnosis, orders, and follow-up were discussed with the patient. The patient had all questions answered satisfactorily and understands this recommended plan of care.    Author:  Osborne Oman 11/14/2019 5:47 PM

## 2019-11-15 NOTE — Patient Instructions
Prevention Guidelines, Men Ages 50 to 64  Screening tests and vaccines are an important part of managing your health. A screening test is done to find diseases in people who don't have any symptoms. The goal is to find a disease early so lifestyle changes and checkups can reduce the risk of disease. Or the goal may be to detect it early to treat it most effectively. Screening tests are not used to diagnose a disease. But they are used to see if more testing is needed. Health counseling is important, too. Below are guidelines for these, for men ages 50 to 64. Keep in mind that screening recommendations vary among expert groups. Talk with your healthcare provider about which tests are best for you and to make sure you're up-to-date on what you need.  Screening Who needs it  How often   Unhealthy alcohol use All men in this age group At routine exams   Blood pressure All men in this age group Yearly checkup if your blood pressure is normal  Normal blood pressure is less than 120/80 mm Hg  If your blood pressure reading is higher than normal, follow the advice of your healthcare provider   Colorectal cancer All men at average risk in this age group Multiple tests are available and are used at different times. Possible tests include:   Flexible sigmoidoscopy every 5 years, or   Colonoscopy every 10 years, or   CT colonography (virtual colonoscopy) every 5 years, or   Yearly fecal occult blood test, or   Yearly fecal immunochemical test every year, or   Stool DNA test, every 3 years  If you choose a test other than a colonoscopy and have an abnormal test result, you will need to follow-up with a colonoscopy. Screening recommendations advice vary varies among expert groups. Talk with your healthcare provider about which tests are best for you.  Some people should be screened using a different schedule because of their personal or family health history. Talk with your healthcare provider about your health history.      Depression All men in this age group At routine exams   Type 2 diabetes or prediabetes All men beginning at age 45 and men without symptoms at any age who are overweight or obese and have 1 or more other risk factors for diabetes At least every 3 years (yearly if your blood sugar has already begun to rise)   Type 2 diabetes All men with prediabetes Every year   Hepatitis C Men at increased risk for infection; 1 time for those born between 1945 and 1965 At routine exams; talk with your healthcare provider.   High cholesterol or triglycerides All men in this age group At least every 5 years; talk with your healthcare provider about your risk   HIV All males up to age 64 and men at increased risk. At least once up to age 64 at routine exams; talk with your healthcare provider if you are at risk   Lung cancer Men between the ages of 55 to 74 who in fairly good health and are at higher risk for lung cancer   Currently smoke or who have quit within past 15 years   30-pack year smoking history  a Eligibility criteria and age limit (possibly up to age 80) may vary across major organizations   Yearly lung cancer screening with a low-dose CT scan (LDCT); talk with your healthcare provider   Obesity All men in this age group At yearly   routine exams   BMI (body mass index) All men in this age group Every year, to help find out if you are at a healthy weight for your height   Prostate cancer Starting at age 50, talk with your healthcare provider about risks and benefits of testing with digital rectal exam (DRE) and prostate-specific antigen (PSA) screening At routine exams if you decide to be tested.   Syphilis Men at increased risk for infection At routine exams; talk with your healthcare provider   Tuberculosis Men at increased risk for infection Talk with your healthcare provider   Vision All men in this age group Talk with your healthcare provider   Vaccine Who needs it How often   Chickenpox (varicella) All men in this  age group who have no record of this infection or vaccine 2 doses; second dose should be given at least 4 weeks after the first dose   Hepatitis A Men at increased risk for infection 2 or 3 doses (depending on the vaccine) given at least 6 months apart; talk with your healthcare provider   Hepatitis B Men at increased risk for infection 2 or 3 doses (depending on the vaccine) second dose should be given 1 month after the first dose; if a third dose , it should be given at least 2 months after the second dose and at least 4 months after the first dose; talk with your healthcare provider   Haemophilus influenzaeType B (HIB) Men at increased risk for infection 1 or 3 doses; talk with your healthcare provider   Influenza (flu) All men in this age group Once a year   Measles, mumps, rubella (MMR) Men in this age group born in 1957 or later who have no record of these infections or vaccines 1 or 2 doses; talk with your healthcare provider   Meningococcal ACWY (MenACWY) Men at increased risk for infection 1 or 2 doses depending on your case. Then a booster every 5 years if you are still at risk. Talk with your healthcare provider.   Meningococcal B (MenB) Men at increased risk for infection 2 or 3 doses, depending on the vaccine and your case; talk with your healthcare provider   Pneumococcal conjugate vaccine (PCV13)and pneumococcal polysaccharidevaccine(PPSV23) Men at increased risk for infection PCV13: 1 dose ages 19 to 65 (protects against 13 types of pneumococcal bacteria)  PPSV23: 1 to2 doses through age 64, (protects against 23 types of pneumococcal bacteria)   Tetanus/diphtheria/pertussis (Td/Tdap) booster All men in this age group Td every 10 years, or a 1-time dose of Tdap instead of a Td booster after age 18, then Td every 10 years   Zoster (Shingles) All men in this age group 2 vaccines are available:     Recombinant zoster vaccine (RZV; Shigrix) is recommended as the preferred shingles vaccine. It is  given in a series of 2 doses. The 2nd dose is given 2 to 6 months after the first. This is given even if you've had shingles before or had a previous Zoster live vaccine (ZVL; Zostavax)   Zoster live vaccine (ZVL; Zostavax) may be given to healthy adults over age 60. It's given in one dose     Counseling Who needs it How often   Diet and exercise Men who are overweight or obese When diagnosed, and then at routine exams   Sexually transmitted infection prevention Men at increased risk for infection At routine exams; talk with your healthcare provider   Use of daily aspirin Men ages   50 years and up in this age group who are at high risk for cardiovascular health problems and not at increased risk for bleeding as identified by their healthcare provider y At routine exams; talk with your healthcare provider   Use of statins Men between the ages of 40 and 75 years who have ; men   An LDL-C level of more than 70 mg/dL but less than 190 mg/dL, no diabetes and borderline to high level of risk   An LDL-C level of 190 mg/dL or greater   A diagnosis of diabetes and LDL-C level of greater than 70mg/dL At routine exams, or more often as directed by your healthcare provider. Statin dosages may vary based on your overall health, risk factors, and other health conditions such as diabetes. Talk with your healthcare provider about your risk .   Use of tobacco and the health effects it can cause All men in this age group Every exam   [employer] last reviewed this educational content on 07/15/2017   2000-2020 [employer]. All rights reserved. This information is not intended as a substitute for professional medical care. Always follow your healthcare professional's instructions.

## 2019-11-16 ENCOUNTER — Inpatient Hospital Stay: Payer: PRIVATE HEALTH INSURANCE | Attending: Gastroenterology

## 2019-11-16 ENCOUNTER — Ambulatory Visit: Payer: BLUE CROSS/BLUE SHIELD

## 2019-11-16 ENCOUNTER — Telehealth: Payer: BLUE CROSS/BLUE SHIELD

## 2019-11-16 DIAGNOSIS — Z01812 Encounter for preprocedural laboratory examination: Secondary | ICD-10-CM

## 2019-11-16 MED ORDER — SUPREP BOWEL PREP KIT 17.5-3.13-1.6 GM/177ML PO SOLN
0 refills | Status: AC
Start: 2019-11-16 — End: ?

## 2019-11-16 NOTE — Telephone Encounter
[facility] Procedure Checklist:     [x]  COVID Order has been pended and sent to MD performing procedure to sign.     [] Open access patient- Pharmacy on file has been confirmed and bowel prep medication has been pended.    Prep instructions have been delivered to patient via:  [] Email to __________  [] Fax to ____________  [] Mail to Address on file  [x] Sent via letter on Mychart  [] Preps provided by the office      Encounter sent to [facility] team if the following applies:  [] Procedure scheduled on:  [] Changes have been made to current procedure scheduled.       MAC Script    Patient has been advised of method of anesthesia that will be used in this procedure.  If MAC used, MAC script has been advised.  If IVCS used, pt given option of MAC.     [x] Pt has been offered all options and is requesting to be scheduled with MAC. Patient will pay $200 if authorization is denied.    [] Pt has been scheduled in [place] on sedation day (if insurance applicable).      [] Patient has requested to be scheduled with conscious sedation in one of our community Medical procedure units.

## 2019-12-14 ENCOUNTER — Telehealth: Payer: PRIVATE HEALTH INSURANCE

## 2019-12-14 ENCOUNTER — Ambulatory Visit: Payer: PRIVATE HEALTH INSURANCE

## 2019-12-14 NOTE — Telephone Encounter
Confirmation Documentation   (left msg) procedure(s)   Date: 12/21/2019  Time: 0945  Check-in Time: 0845  NOTE:  [facility]: Check in time is 1.5 hours prior to procedure in [address].   [place] [facility]: If patient is scheduled at 7am, Check in time is at 6:15am        Colonoscopy  X   Endoscopy     Bravo    Cath Placement    Sigmoidoscopy     Small Bowel Entero.    Esophageal Manometry    Anorectal Manometry    Biofeedback    pH study    Capsule Endoscopy    Secretin Infusion Test    Sham Feeding Study        Informed pt:  1. Of Arrival time (time varies based on location)?  2. Of location and room number?  3. Make sure there is a confirmed ride for the procedure.  4. Has procedure been authorized?  5. Was MAC Script provided?   6. Reviewed Prep instructions with patient? Y/N?  7. Has COVID appt been scheduled?  8. Scheduled procedure and physician's order match? Y/N?  9. Informed patient to call back with any questions and provided number.Y/N?    NOTE: If patients have any questions regarding prescribed medication patient needs to contact their prescribing physician to ensure it is ok to stop medications.      Comments/Notes:        If VM is left and pt calls back, please warm transfer patient to [phone]. This number is for internal use only and is not to be provided to patients.

## 2019-12-19 ENCOUNTER — Ambulatory Visit: Payer: BLUE CROSS/BLUE SHIELD

## 2021-08-19 ENCOUNTER — Ambulatory Visit (INDEPENDENT_AMBULATORY_CARE_PROVIDER_SITE_OTHER): Payer: BC Managed Care – PPO

## 2021-08-19 ENCOUNTER — Ambulatory Visit (INDEPENDENT_AMBULATORY_CARE_PROVIDER_SITE_OTHER): Payer: BC Managed Care – PPO | Admitting: Podiatry

## 2021-08-19 DIAGNOSIS — G5763 Lesion of plantar nerve, bilateral lower limbs: Secondary | ICD-10-CM

## 2021-08-19 DIAGNOSIS — M79671 Pain in right foot: Secondary | ICD-10-CM

## 2021-08-19 MED ORDER — MELOXICAM 15 MG PO TABS: 15.0000 mg | ORAL_TABLET | Freq: Every day | ORAL | 0 refills | Status: DC | PRN

## 2021-08-26 NOTE — Progress Notes (Signed)
Subjective:   Patient ID: Daniel Huffman, male   DOB: 61 y.o.   MRN: [id number]   HPI 61 year old male presents the office with concerns of right foot pain started about 2 months ago.  Describes burning, numbness into his toes.  For the last couple weeks has been more consistent.  States he feels a lump at times or a knot on the bottom of the foot.  No recent injury which he reports.  He tried stretching, anti-inflammatories, ice without significant improvement.   Review of Systems  All other systems reviewed and are negative.  No past medical history on file.   Current Outpatient Medications:    meloxicam (MOBIC) 15 MG tablet, Take 1 tablet (15 mg total) by mouth daily as needed for pain., Disp: 30 tablet, Rfl: 0  Not on File         Objective:  Physical Exam  General: AAO x3, NAD  Dermatological: Skin is warm, dry and supple bilateral. There are no open sores, no preulcerative lesions, no rash or signs of infection present.  Vascular: Dorsalis Pedis artery and Posterior Tibial artery pedal pulses are 2/4 bilateral with immedate capillary fill time. There is no pain with calf compression, swelling, warmth, erythema.   Neruologic: Grossly intact via light touch bilateral.  Negative Tinel sign.  Musculoskeletal: Tenderness palpation along the second third interspaces and small palpable neuroma is identified.  There is no area pinpoint tenderness.  No.  Imaging remission.  Flexor, extensor tendons appear to be intact.  Muscular strength 5/5 in all groups tested bilateral.  Gait: Unassisted, Nonantalgic.       Assessment:   Neuroma right foot     Plan:  -Treatment options discussed including all alternatives, risks, and complications -Etiology of symptoms were discussed -X-rays were obtained and reviewed with the patient.  3 views of the right foot were obtained.  No evidence of acute fracture. -Prescribed mobic. Discussed side effects of the medication and directed to stop  if any are to occur and call the office.  -Dispensed offloading pads placed with shoes and good arch support.   -Discussed daily injection as well today.  Vivi Barrack DPM

## 2021-09-15 ENCOUNTER — Other Ambulatory Visit: Payer: Self-pay | Admitting: Podiatry

## 2021-10-23 ENCOUNTER — Ambulatory Visit: Payer: PRIVATE HEALTH INSURANCE

## 2021-10-23 DIAGNOSIS — E119 Type 2 diabetes mellitus without complications: Secondary | ICD-10-CM

## 2021-10-23 DIAGNOSIS — Z0100 Encounter for examination of eyes and vision without abnormal findings: Secondary | ICD-10-CM

## 2021-12-09 ENCOUNTER — Ambulatory Visit (INDEPENDENT_AMBULATORY_CARE_PROVIDER_SITE_OTHER): Payer: 59 | Admitting: Orthopaedic Surgery

## 2021-12-09 ENCOUNTER — Ambulatory Visit (INDEPENDENT_AMBULATORY_CARE_PROVIDER_SITE_OTHER): Payer: 59

## 2021-12-09 VITALS — Ht 73.5 in | Wt 312.0 lb

## 2021-12-09 DIAGNOSIS — M25552 Pain in left hip: Secondary | ICD-10-CM

## 2021-12-09 NOTE — Progress Notes (Signed)
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  He says he tweaked his left hip about 2 months ago playing pickle ball and he is taken 40 mg of Advil sometimes twice a day to get the pain to subside.  It is mainly in his groin where he hurts.  He does have remote history of a right total hip arthroplasty done around 12 years ago in [place].  This was done through an anterior approach.  He is very active.  He is not a diabetic.  He said Tylenol is also helped and he is thinking about supplements for his joints.  He says he is feeling better but he is still having some pain.  His only other comorbidity is his weight; he does weigh 312 pounds.  On examination he walks with a normal-appearing gait.  His right operative hip moves smoothly and fluidly.  His left hip has excellent range of motion with no blocks or rotation we just a little bit of pain in the groin.  There is no pain over the trochanteric area.  An AP pelvis and lateral of his left hip reviewed.  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  His right hip replacement looks great.  He is feeling better overall and I think his activity modification and anti-inflammatories have helped.  I did give him the information for my partner Dr. Rolena Infante to consider an ultrasound assessment of that left hip if he keeps bothering him and to potentially have an intra-articular steroid injection in the left hip as my next step of treatment.  He said he would take that in consideration and has partners information.  All questions were answered and addressed.  If things worsen he knows to let us know.

## 2021-12-31 ENCOUNTER — Ambulatory Visit (INDEPENDENT_AMBULATORY_CARE_PROVIDER_SITE_OTHER): Payer: 59 | Admitting: Podiatry

## 2021-12-31 DIAGNOSIS — G5763 Lesion of plantar nerve, bilateral lower limbs: Secondary | ICD-10-CM

## 2021-12-31 MED ORDER — METHYLPREDNISOLONE 4 MG PO TBPK: ORAL_TABLET | ORAL | 0 refills | Status: DC

## 2021-12-31 NOTE — Progress Notes (Signed)
Subjective: Chief Complaint  Patient presents with   Neuroma    Right foot neuroma, on the forefoot between 2nd and 3rd toe, rate of pain 6 out of 10, burning feels like stepping on a pad, patient states he is doing the same as last visit,    60 y.o. male presents with the above concerns. The mobic was not helping much. He tries to ignore the pain. He gets burning and some nerve to the toes when trying to stretch it. In th e morning when het gets up and it burns at first at times and sometimes when he walks on it is fine another times it is noticeable.  Injection but possible.  Objective: AAO x3, NAD DP/PT pulses palpable bilaterally, CRT less than 3 seconds Pain right foot 2nd interspace and somewhat along the 2nd and 3rd metatarsal head.  No edema, erythema.  Flexor, extensor tendons appear to be intact.  No area pinpoint tenderness.  MMT/5. No pain with calf compression, swelling, warmth, erythema  Assessment: Neuroma, capsulitis right foot  Plan: -All treatment options discussed with the patient including all alternatives, risks, complications.  -Wants to avoid injection. Medrol dose pack. Reponitioned metatarsal pad to help further offload.  If no improvement consider steroid injection. -Patient encouraged to call the office with any questions, concerns, change in symptoms.   Trula Slade DPM

## 2021-12-31 NOTE — Patient Instructions (Signed)

## 2022-03-03 ENCOUNTER — Ambulatory Visit (INDEPENDENT_AMBULATORY_CARE_PROVIDER_SITE_OTHER): Payer: 59 | Admitting: Podiatry

## 2022-03-03 VITALS — BP 141/89 | HR 84

## 2022-03-03 DIAGNOSIS — M79672 Pain in left foot: Secondary | ICD-10-CM

## 2022-03-03 DIAGNOSIS — G5761 Lesion of plantar nerve, right lower limb: Secondary | ICD-10-CM | POA: Diagnosis not present

## 2022-03-03 MED ORDER — TRIAMCINOLONE ACETONIDE 10 MG/ML IJ SUSP
10.0000 mg | Freq: Once | INTRAMUSCULAR | Status: AC
  Administered 2022-03-03: 10 mg

## 2022-03-03 NOTE — Progress Notes (Signed)
Subjective: Chief Complaint  Patient presents with   Neuroma    Patient came in today for Morton's neuroma follow-up, patient right still having pain( 7 rate of pain) , left foot in the arch when walking (8 rate of pain) which started a 2 weeks ago,     61 y.o. male presents with the above concerns. He finished the steroids and it helped the hip but did not do much for the foot. He takes NSAIDs for for hip pain. He has been getting arch pain on the left likely from compensation. This started in the last 3-4 weeks. If he "corrects my foot position" the symptoms go away.  No recent injury or other concerns today.   Objective: AAO x3, NAD DP/PT pulses palpable bilaterally, CRT less than 3 seconds Pain right foot 2nd interspace of the right foot.  Trace edema.  No erythema.  No area pinpoint tenderness. Some mild discomfort noted along the arch of the left foot.  This along the band of plantar fascia.  Appears to be intact.  No area pinpoint tenderness. No pain with calf compression, swelling, warmth, erythema  Assessment: Neuroma, capsulitis right foot; left arch pain/plantar fasciitis  Plan: -All treatment options discussed with the patient including all alternatives, risks, complications.  -Decided to proceed with steroid injection of the right second interspace today.  Skin skin with alcohol and mixture 1 cc Kenalog 10, 0.5 cc of Marcaine plain, 0.5 cc of lidocaine plain was infiltrated into the second interspace complications.  Postinjection care discussed.  Tolerated well.  Continue offloading.  Discussed physical therapy.  No improvement also consider advanced imaging -For the left side discussed resting, icing a regular basis as well as shoes and good arch supports.  Return in about 6 weeks (around 04/14/2022).  Vivi Barrack DPM

## 2022-03-03 NOTE — Patient Instructions (Signed)

## 2022-03-24 ENCOUNTER — Ambulatory Visit: Payer: Self-pay

## 2022-03-24 ENCOUNTER — Ambulatory Visit (INDEPENDENT_AMBULATORY_CARE_PROVIDER_SITE_OTHER): Payer: 59 | Admitting: Sports Medicine

## 2022-03-24 DIAGNOSIS — M25552 Pain in left hip: Secondary | ICD-10-CM | POA: Diagnosis not present

## 2022-03-24 DIAGNOSIS — M25852 Other specified joint disorders, left hip: Secondary | ICD-10-CM

## 2022-03-24 MED ORDER — LIDOCAINE HCL 1 % IJ SOLN
4.0000 mL | INTRAMUSCULAR | Status: AC | PRN
  Administered 2022-03-24: 4 mL

## 2022-03-24 MED ORDER — METHYLPREDNISOLONE ACETATE 40 MG/ML IJ SUSP
80.0000 mg | INTRAMUSCULAR | Status: AC | PRN
  Administered 2022-03-24: 80 mg via INTRA_ARTICULAR

## 2022-03-24 NOTE — Progress Notes (Signed)
Patient was instructed in 10 minutes of therapeutic exercises for left hip to improve strength, ROM and function according to my instructions and plan of care by a Certified Athletic Trainer during the office visit. A customized handout was provided and demonstration of proper technique shown and discussed. Patient did perform exercises and demonstrate understanding through teachback.  All questions discussed and answered.

## 2022-03-24 NOTE — Progress Notes (Signed)
Daniel Huffman - 62 y.o. male MRN [id number]  Date of birth: 10-18-1960  Office Visit Note: Visit Date: 03/24/2022 PCP: Doyle Askew, PA-C Referred by: Doyle Askew, PA-C  Subjective: Chief Complaint  Patient presents with   Left Hip - Pain   HPI: Daniel Huffman is a pleasant 62 y.o. male who presents today for follow-up of left hip pain.  He previously Dr. Ninfa Linden on 12/09/2021 and he thought this was coming from the intra-articular hip, however he did not have advanced arthritis at that time.  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  He was able to put off the pain with some ibuprofen at times, however this has worsened and he is interested in discussing options such as hip injection.  He does have a prior right hip replacement from a total hip arthroplasty about 12 years ago done in [place].  Pertinent ROS were reviewed with the patient and found to be negative unless otherwise specified above in HPI.   Assessment & Plan: Visit Diagnoses:  1. Pain in left hip   2. Hip impingement syndrome, left    Plan: Discussed Tom's hip pain.  Reviewed his x-rays.  Seems more indicative of FAI as he does not have advanced osteoarthritis of the hip, although it is certainly irritable.  Treatment options, elected to proceed with diagnostic and likely therapeutic ultrasound-guided hip injection.  He had excellent relief in his pain and function minutes after the injection, which we discussed was only the anesthetic effect but this is reassuring.  Myself and my athletic trainer did review a hip home rehab protocol for him to begin after resting for the first 48 hours.  May use ice or over-the-counter anti-inflammatories for any postinjection pain.  Follow-up: In about 2-3 weeks with Dr. Ninfa Linden or myself if pain is not improving  Meds & Orders: No orders of the defined types were placed in this encounter.   Orders Placed This Encounter   Procedures   Large Joint Inj   US Guided Needle Placement - No Linked Charges     Procedures: Large Joint Inj: L hip joint on 03/24/2022 5:28 PM Indications: pain Details: 22 G 3.5 in needle, ultrasound-guided anterior approach Medications: 4 mL lidocaine 1 %; 80 mg methylPREDNISolone acetate 40 MG/ML Outcome: tolerated well, no immediate complications  Procedure: US-guided intra-articular hip injection, left After discussion on risks/benefits/indications and informed verbal consent was obtained, a timeout was performed. Patient was lying supine on exam table. The hip was cleaned with betadine and alcohol swabs. Then utilizing ultrasound guidance, the patient's femoral head and neck junction was identified and subsequently injected with 4:2 lidocaine:depomedrol via an in-plane approach with ultrasound visualization of the injectate administered into the hip joint. Patient tolerated procedure well without immediate complications.  Procedure, treatment alternatives, risks and benefits explained, specific risks discussed. Consent was given by the patient. Immediately prior to procedure a time out was called to verify the correct patient, procedure, equipment, support staff and site/side marked as required. Patient was prepped and draped in the usual sterile fashion.          Clinical History: No specialty comments available.  He has no history on file for tobacco use. No results for input(s): "HGBA1C", "LABURIC" in the last 8760 hours.  Objective:    Physical Exam  Gen: Well-appearing, in no acute distress; non-toxic CV: Regular Rate. Well-perfused. Warm.  Resp: Breathing unlabored on room air; no wheezing.  Psych: Fluid speech in conversation; appropriate affect; normal thought process Neuro: Sensation intact throughout. No gross coordination deficits.   Ortho Exam - Left hip: No bony TTP. No overlying skin changes; no effusion or redness. There are no mechanical blocks to passive  IR/ER with logroll. Some pain with FADIR testing, negative FABERE. Strength 5/5. Normal gait. NVI.  Imaging:  Hip X-ray 12/09/21: XR HIP UNILAT W OR W/O PELVIS 2-3 VIEWS LEFT An AP pelvis lateral left hip shows a well-seated right total hip  arthroplasty.  There is slight evidence of femoral acetabular impingement  on the left side.   Past Medical/Family/Surgical/Social History: Medications & Allergies reviewed per EMR, new medications updated.  Social History   Occupational History   Not on file  Tobacco Use   Smoking status: Not on file   Smokeless tobacco: Not on file  Substance and Sexual Activity   Alcohol use: Not on file   Drug use: Not on file   Sexual activity: Not on file

## 2022-04-14 ENCOUNTER — Ambulatory Visit (INDEPENDENT_AMBULATORY_CARE_PROVIDER_SITE_OTHER): Payer: 59 | Admitting: Podiatry

## 2022-04-14 DIAGNOSIS — G5761 Lesion of plantar nerve, right lower limb: Secondary | ICD-10-CM | POA: Diagnosis not present

## 2022-04-14 DIAGNOSIS — M79672 Pain in left foot: Secondary | ICD-10-CM

## 2022-04-14 NOTE — Progress Notes (Signed)
Subjective: Chief Complaint  Patient presents with   Follow-up    Right foot, pain has subsided due to PT and injection, patient stated heis doing well    Rare to get pain. Feels like a sponge or something on th bottom. The numbness is gone.   62 y.o. male presents with the above concerns.  States he is doing well.  He rarely gets pain.  He feels there is no signs or symptoms of his feet but numbness of the has had previously resolved.  Overall improved.  No recent injuries or changes otherwise.   Objective: AAO x3, NAD DP/PT pulses palpable bilaterally, CRT less than 3 seconds Unable to any pain right foot 2nd interspace of the right foot.  Trace edema.  No erythema.  No area pinpoint tenderness. There is no discomfort noted along the arch of the left foot.  This along the band of plantar fascia.  Appears to be intact.  No area pinpoint tenderness. No pain with calf compression, swelling, warmth, erythema  Assessment: Neuroma, capsulitis right foot; left arch pain/plantar fasciitis  Plan: -All treatment options discussed with the patient including all alternatives, risks, complications.  -Overall from a pain standpoint doing better.  Hopefully symptoms will continue to improve.  He still complains of the nerve symptoms we will need to further investigate this with possibly nerve conduction test.  Continue with shoes, good arch supports as well as offloading.  Return if symptoms worsen or fail to improve.  Trula Slade DPM

## 2022-05-07 ENCOUNTER — Encounter: Payer: Self-pay | Admitting: Podiatry

## 2022-05-07 ENCOUNTER — Encounter: Payer: Self-pay | Admitting: Orthopaedic Surgery

## 2022-05-07 ENCOUNTER — Other Ambulatory Visit: Payer: Self-pay

## 2022-05-07 DIAGNOSIS — M25552 Pain in left hip: Secondary | ICD-10-CM

## 2022-05-07 DIAGNOSIS — M25852 Other specified joint disorders, left hip: Secondary | ICD-10-CM

## 2022-05-30 ENCOUNTER — Ambulatory Visit
Admission: RE | Admit: 2022-05-30 | Discharge: 2022-05-30 | Disposition: A | Discharge status: Home / Self Care | Payer: 59 | Source: Ambulatory Visit | Attending: Orthopaedic Surgery | Admitting: Orthopaedic Surgery

## 2022-05-30 DIAGNOSIS — M25852 Other specified joint disorders, left hip: Secondary | ICD-10-CM

## 2022-05-30 DIAGNOSIS — M25552 Pain in left hip: Secondary | ICD-10-CM

## 2022-05-30 MED ORDER — IOPAMIDOL (ISOVUE-M 200) INJECTION 41%
15.0000 mL | Freq: Once | INTRAMUSCULAR | Status: AC
  Administered 2022-05-30: 15 mL via INTRA_ARTICULAR

## 2022-06-05 ENCOUNTER — Ambulatory Visit (INDEPENDENT_AMBULATORY_CARE_PROVIDER_SITE_OTHER): Payer: 59 | Admitting: Physician Assistant

## 2022-06-05 ENCOUNTER — Encounter: Payer: Self-pay | Admitting: Physician Assistant

## 2022-06-05 DIAGNOSIS — M1612 Unilateral primary osteoarthritis, left hip: Secondary | ICD-10-CM | POA: Diagnosis not present

## 2022-06-05 DIAGNOSIS — M25552 Pain in left hip: Secondary | ICD-10-CM | POA: Diagnosis not present

## 2022-06-05 MED ORDER — CELECOXIB 200 MG PO CAPS: 200.0000 mg | ORAL_CAPSULE | Freq: Every day | ORAL | 2 refills | Status: DC

## 2022-06-05 NOTE — Progress Notes (Signed)
HPI: Daniel Huffman comes in today to review the MRI of his left hip.  Did have intra-articular injection 2 months ago left hip which gave him good relief however he still does have some pain in his left hip it is becoming a little worse.  At this point in time he ranks his pain to be 7 out of 10 pain at worst.  He notes that he has to limit his activities he used to like playing pickle ball and is unable to play pickle ball due to the left .  Pain.  He has been taking ibuprofen which upsets his stomach.  He is also tried Aleve which really did not help as well as the ibuprofen.  He is using no assistive device to ambulate.MRI 06/03/2022 showed moderate to severe arthritis left hip.  Left superior labral tear with degeneration.   Review of systems: See HPI otherwise negative  Physical exam: Well-developed well-nourished male in no acute distress.  Ambulates without any assistive device. Bilateral hips Right  hip good range of motion without pain.  Left hip fluid motion.  Pain with internal rotation left hip.   Impression: Left hip OA  Plan: He is asking about repeat intra-articular injection before he goes to [place] in late May.  Therefore we will set him up for an intra-articular injection with Dr. Rolena Infante in May.  Also having to stop over-the-counter NSAIDs.  Will place him on Celebrex 200 mg once daily.  He will follow-up with [place] in September to reevaluate his hip pain height and weight check.  Questions were encouraged and answered at length.

## 2022-06-19 DIAGNOSIS — M1612 Unilateral primary osteoarthritis, left hip: Secondary | ICD-10-CM

## 2022-06-19 DIAGNOSIS — M25552 Pain in left hip: Secondary | ICD-10-CM

## 2022-06-23 ENCOUNTER — Ambulatory Visit: Payer: 59 | Admitting: Physician Assistant

## 2022-06-23 NOTE — Telephone Encounter (Signed)
Referral placed in chart  

## 2022-07-16 ENCOUNTER — Ambulatory Visit (INDEPENDENT_AMBULATORY_CARE_PROVIDER_SITE_OTHER): Payer: 59 | Admitting: Orthopaedic Surgery

## 2022-07-16 VITALS — Ht 73.0 in | Wt 307.0 lb

## 2022-07-16 DIAGNOSIS — M1612 Unilateral primary osteoarthritis, left hip: Secondary | ICD-10-CM | POA: Insufficient documentation

## 2022-07-16 NOTE — Progress Notes (Signed)
The patient is here today to discuss hip replacement surgery.  He is actually scheduled for a left anterior total hip arthroplasty on 08/05/2022.  He had his right hip replaced in 2013 through an anterior approach.  He is not a diabetic.  His BMI is right at 40.  He does mainly desk work.  We have already had a MRI of his right hip showing an extensive labral tear and moderate to severe arthritis with cartilage loss of the weightbearing surface of his right hip.  Today we went over hip replacement surgery in detail.  We discussed the risks and benefits of the surgery.  We talked about what to expect from an intraoperative and postoperative course and went over the hip replacement model with him as well.  His right hip moves smoothly.  His left hip has a lot of stiffness with rotation and significant pain in the groin with rotation.  It is detrimentally affecting his mobility, his quality of life and his activities of daily living.  We will proceed with surgery on May 21 for a left total hip arthroplasty.  All questions concerns were addressed and answered.

## 2022-07-24 ENCOUNTER — Other Ambulatory Visit: Payer: Self-pay

## 2022-07-28 ENCOUNTER — Ambulatory Visit: Payer: 59 | Admitting: Sports Medicine

## 2022-07-29 NOTE — Progress Notes (Signed)
Surgical Instructions    Your procedure is scheduled on Tuesday Aug 05, 2022.  Report to [facility] Main Entrance "A" at 7:30 A.M., then check in with the Admitting office.  Call this number if you have problems the morning of surgery:  [phone]   If you have any questions prior to your surgery date call [phone]: Open Monday-Friday 8am-4pm If you experience any cold or flu symptoms such as cough, fever, chills, shortness of breath, etc. between now and your scheduled surgery, please notify us at the above number     Remember:  Do not eat after midnight the night before your surgery  You may drink clear liquids until 6:30 the morning of your surgery.   Clear liquids allowed are: Water, Non-Citrus Juices (without pulp), Carbonated Beverages, Clear Tea, Black Coffee ONLY (NO MILK, CREAM OR POWDERED CREAMER of any kind), and Gatorade.   Enhanced Recovery after Surgery for Orthopedics Enhanced Recovery after Surgery is a protocol used to improve the stress on your body and your recovery after surgery.  Patient Instructions  The day of surgery (if you do NOT have diabetes):  Drink ONE (1) Pre-Surgery Clear Ensure by 6:30 am the morning of surgery   This drink was given to you during your hospital  pre-op appointment visit. Nothing else to drink after completing the  Pre-Surgery Clear Ensure.        If you have questions, please contact your surgeon's office.     Take these medicines the morning of surgery with A SIP OF WATER:  celecoxib (CELEBREX)  triamcinolone (NASACORT ALLERGY 24HR)   As of today, STOP taking any Aspirin (unless otherwise instructed by your surgeon) Aleve, Naproxen, Ibuprofen, Motrin, Advil, Goody's, BC's, all herbal medications, fish oil, and all vitamins.  Special instructions:    Oral Hygiene is also important to reduce your risk of infection.  Remember - BRUSH YOUR TEETH THE MORNING OF SURGERY WITH YOUR REGULAR TOOTHPASTE   [facility]- Preparing  For Surgery   Pre-operative 5 CHG Bath Instructions   You can play a key role in reducing the risk of infection after surgery. Your skin needs to be as free of germs as possible. You can reduce the number of germs on your skin by washing with CHG (chlorhexidine gluconate) soap before surgery. CHG is an antiseptic soap that kills germs and continues to kill germs even after washing.   DO NOT use if you have an allergy to chlorhexidine/CHG or antibacterial soaps. If your skin becomes reddened or irritated, stop using the CHG and notify one of our RNs at [phone].   Please shower with the CHG soap starting 4 days before surgery using the following schedule:     Please keep in mind the following:  DO NOT shave, including legs and underarms, starting the day of your first shower.   You may shave your face at any point before/day of surgery.  Place clean sheets on your bed the day you start using CHG soap. Use a clean washcloth (not used since being washed) for each shower. DO NOT sleep with pets once you start using the CHG.   CHG Shower Instructions:  If you choose to wash your hair and private area, wash first with your normal shampoo/soap.  After you use shampoo/soap, rinse your hair and body thoroughly to remove shampoo/soap residue.  Turn the water OFF and apply about 3 tablespoons (45 ml) of CHG soap to a CLEAN washcloth.  Apply CHG soap ONLY FROM YOUR  NECK DOWN TO YOUR TOES (washing for 3-5 minutes)  DO NOT use CHG soap on face, private areas, open wounds, or sores.  Pay special attention to the area where your surgery is being performed.  If you are having back surgery, having someone wash your back for you may be helpful. Wait 2 minutes after CHG soap is applied, then you may rinse off the CHG soap.  Pat dry with a clean towel  Put on clean clothes/pajamas   If you choose to wear lotion, please use ONLY the CHG-compatible lotions on the back of this paper.     Additional  instructions for the day of surgery: DO NOT APPLY any lotions, deodorants, cologne, or perfumes.   Put on clean/comfortable clothes.  Brush your teeth.  Ask your nurse before applying any prescription medications to the skin.      CHG Compatible Lotions   Aveeno Moisturizing lotion  Cetaphil Moisturizing Cream  Cetaphil Moisturizing Lotion  Clairol Herbal Essence Moisturizing Lotion, Dry Skin  Clairol Herbal Essence Moisturizing Lotion, Extra Dry Skin  Clairol Herbal Essence Moisturizing Lotion, Normal Skin  Curel Age Defying Therapeutic Moisturizing Lotion with Alpha Hydroxy  Curel Extreme Care Body Lotion  Curel Soothing Hands Moisturizing Hand Lotion  Curel Therapeutic Moisturizing Cream, Fragrance-Free  Curel Therapeutic Moisturizing Lotion, Fragrance-Free  Curel Therapeutic Moisturizing Lotion, Original Formula  Eucerin Daily Replenishing Lotion  Eucerin Dry Skin Therapy Plus Alpha Hydroxy Crme  Eucerin Dry Skin Therapy Plus Alpha Hydroxy Lotion  Eucerin Original Crme  Eucerin Original Lotion  Eucerin Plus Crme Eucerin Plus Lotion  Eucerin TriLipid Replenishing Lotion  Keri Anti-Bacterial Hand Lotion  Keri Deep Conditioning Original Lotion Dry Skin Formula Softly Scented  Keri Deep Conditioning Original Lotion, Fragrance Free Sensitive Skin Formula  Keri Lotion Fast Absorbing Fragrance Free Sensitive Skin Formula  Keri Lotion Fast Absorbing Softly Scented Dry Skin Formula  Keri Original Lotion  Keri Skin Renewal Lotion Keri Silky Smooth Lotion  Keri Silky Smooth Sensitive Skin Lotion  Nivea Body Creamy Conditioning Oil  Nivea Body Extra Enriched Lotion  Nivea Body Original Lotion  Nivea Body Sheer Moisturizing Lotion Nivea Crme  Nivea Skin Firming Lotion  NutraDerm 30 Skin Lotion  NutraDerm Skin Lotion  NutraDerm Therapeutic Skin Cream  NutraDerm Therapeutic Skin Lotion  ProShield Protective Hand Cream  Provon moisturizing lotion   Day of Surgery:  Take  a shower with CHG soap. Wear Clean/Comfortable clothing the morning of surgery Do not apply any deodorants/lotions.   Remember to brush your teeth WITH YOUR REGULAR TOOTHPASTE.  Do not wear jewelry or makeup. Do not wear lotions, powders, perfumes/cologne or deodorant. Do not shave 48 hours prior to surgery.  Men may shave face and neck. Do not bring valuables to the hospital. Do not wear nail polish, gel polish, artificial nails, or any other type of covering on natural nails (fingers and toes) If you have artificial nails or gel coating that need to be removed by a nail salon, please have this removed prior to surgery. Artificial nails or gel coating may interfere with anesthesia's ability to adequately monitor your vital signs.  [facility] is not responsible for any belongings or valuables.    Do NOT Smoke (Tobacco/Vaping)  24 hours prior to your procedure  If you use a CPAP at night, you may bring your mask for your overnight stay.   Contacts, glasses, hearing aids, dentures or partials may not be worn into surgery, please bring cases for these belongings   For  patients admitted to the hospital, discharge time will be determined by your treatment team.   Patients discharged the day of surgery will not be allowed to drive home, and someone needs to stay with them for 24 hours.   SURGICAL WAITING ROOM VISITATION Patients having surgery or a procedure may have no more than 2 support people in the waiting area - these visitors may rotate.   Children under the age of 87 must have an adult with them who is not the patient. If the patient needs to stay at the hospital during part of their recovery, the visitor guidelines for inpatient rooms apply. Pre-op nurse will coordinate an appropriate time for 1 support person to accompany patient in pre-op.  This support person may not rotate.   Please refer to [email or web address] for the visitor  guidelines for Inpatients (after your surgery is over and you are in a regular room).   If you received a COVID test during your pre-op visit, it is requested that you wear a mask when out in public, stay away from anyone that may not be feeling well, and notify your surgeon if you develop symptoms. If you have been in contact with anyone that has tested positive in the last 10 days, please notify your surgeon.    Please read over the following fact sheets that you were given.

## 2022-07-30 ENCOUNTER — Encounter (HOSPITAL_COMMUNITY)
Admission: RE | Admit: 2022-07-30 | Discharge: 2022-07-30 | Disposition: A | Discharge status: Home / Self Care | Payer: 59 | Source: Ambulatory Visit | Attending: Orthopaedic Surgery | Admitting: Orthopaedic Surgery

## 2022-07-30 ENCOUNTER — Other Ambulatory Visit: Payer: Self-pay

## 2022-07-30 ENCOUNTER — Encounter (HOSPITAL_COMMUNITY): Payer: Self-pay

## 2022-07-30 VITALS — BP 126/89 | HR 85 | Temp 98.3°F | Resp 17 | Ht 73.0 in | Wt 311.6 lb

## 2022-07-30 DIAGNOSIS — M1612 Unilateral primary osteoarthritis, left hip: Secondary | ICD-10-CM | POA: Insufficient documentation

## 2022-07-30 DIAGNOSIS — Z01812 Encounter for preprocedural laboratory examination: Secondary | ICD-10-CM | POA: Insufficient documentation

## 2022-07-30 DIAGNOSIS — Z01818 Encounter for other preprocedural examination: Secondary | ICD-10-CM

## 2022-07-30 HISTORY — DX: Other complications of anesthesia, initial encounter: T88.59XA

## 2022-07-30 LAB — COMPREHENSIVE METABOLIC PANEL
ALT: 27 U/L (ref 0–44)
AST: 21 U/L (ref 15–41)
Albumin: 3.8 g/dL (ref 3.5–5.0)
Alkaline Phosphatase: 50 U/L (ref 38–126)
Anion gap: 10 (ref 5–15)
BUN: 15 mg/dL (ref 8–23)
CO2: 25 mmol/L (ref 22–32)
Calcium: 9.3 mg/dL (ref 8.9–10.3)
Chloride: 102 mmol/L (ref 98–111)
Creatinine, Ser: 1 mg/dL (ref 0.61–1.24)
GFR, Estimated: >60 mL/min (ref >60)
Glucose, Bld: 114 mg/dL — ABNORMAL HIGH (ref 70–99)
Potassium: 4.6 mmol/L (ref 3.5–5.1)
Sodium: 137 mmol/L (ref 135–145)
Total Bilirubin: 0.5 mg/dL (ref 0.3–1.2)
Total Protein: 7.5 g/dL (ref 6.5–8.1)

## 2022-07-30 LAB — TYPE AND SCREEN
ABO/RH(D): A POS
Antibody Screen: NEGATIVE

## 2022-07-30 LAB — CBC
HCT: 47.2 % (ref 39.0–52.0)
Hemoglobin: 15.3 g/dL (ref 13.0–17.0)
MCH: 28.7 pg (ref 26.0–34.0)
MCHC: 32.4 g/dL (ref 30.0–36.0)
MCV: 88.4 fL (ref 80.0–100.0)
Platelets: 303 10*3/uL (ref 150–400)
RBC: 5.34 MIL/uL (ref 4.22–5.81)
RDW: 14.3 % (ref 11.5–15.5)
WBC: 8.1 10*3/uL (ref 4.0–10.5)
nRBC: 0 % (ref 0.0–0.2)

## 2022-07-30 LAB — SURGICAL PCR SCREEN
MRSA, PCR: NEGATIVE
Staphylococcus aureus: POSITIVE — AB

## 2022-07-30 NOTE — Progress Notes (Signed)
   07/30/22 0904  OBSTRUCTIVE SLEEP APNEA  Have you ever been diagnosed with sleep apnea through a sleep study? No  Do you snore loudly (loud enough to be heard through closed doors)?  1  Do you often feel tired, fatigued, or sleepy during the daytime (such as falling asleep during driving or talking to someone)? 0  Has anyone observed you stop breathing during your sleep? 0  Do you have, or are you being treated for high blood pressure? 0  BMI more than 35 kg/m2? 1  Age > 50 (1-yes) 1  Neck circumference greater than:Male 16 inches or larger, Male 17inches or larger? 1  Male Gender (Yes=1) 1  Obstructive Sleep Apnea Score 5  Score 5 or greater  Results sent to PCP

## 2022-07-30 NOTE — Progress Notes (Signed)
PCP - Betsey Holiday PA Cardiologist - Denies  PPM/ICD - Denies  Chest x-ray - N/I EKG - N/I Stress Test - Denies ECHO - Denies Cardiac Cath - Denies  Sleep Study - No, has been recommended  DM - Denies  Blood Thinner Instructions: N/A Aspirin Instructions:n/a  ERAS Protcol - Yes PRE-SURGERY Ensure given   COVID TEST- N/I   Anesthesia review: No  Patient denies shortness of breath, fever, cough and chest pain at PAT appointment   All instructions explained to the patient, with a verbal understanding of the material. Patient agrees to go over the instructions while at home for a better understanding.  The opportunity to ask questions was provided.

## 2022-08-04 NOTE — H&P (Signed)
TOTAL HIP ADMISSION H&P  Patient is admitted for left total hip arthroplasty.  Subjective:  Chief Complaint: left hip pain  HPI: Daniel Huffman, 62 y.o. male, has a history of pain and functional disability in the left hip(s) due to arthritis and patient has failed non-surgical conservative treatments for greater than 12 weeks to include NSAID's and/or analgesics, corticosteriod injections, and activity modification.  Onset of symptoms was abrupt starting 10 months ago with gradually worsening course since that time.The patient noted no past surgery on the left hip(s).  Patient currently rates pain in the left hip at 10 out of 10 with activity. Patient has night pain, worsening of pain with activity and weight bearing, trendelenberg gait, pain that interfers with activities of daily living, and pain with passive range of motion. Patient has evidence of subchondral sclerosis, periarticular osteophytes, joint space narrowing, and degenerative labral tearing  by imaging studies. This condition presents safety issues increasing the risk of falls.  There is no current active infection.  Patient Active Problem List   Diagnosis Date Noted   Primary osteoarthritis of left hip 07/16/2022   Past Medical History:  Diagnosis Date   Complication of anesthesia    Had trouble waking with R hip surgery at [facility] 08/23/09   Osteoarthritis 2011   R & L hip    Past Surgical History:  Procedure Laterality Date   APPENDECTOMY  2001   TONSILLECTOMY  1967    No current facility-administered medications for this encounter.   Current Outpatient Medications  Medication Sig Dispense Refill Last Dose   celecoxib (CELEBREX) 200 MG capsule Take 1 capsule (200 mg total) by mouth daily. 30 capsule 2    triamcinolone (NASACORT ALLERGY 24HR) 55 MCG/ACT AERO nasal inhaler Place 2 sprays into the nose daily.      Allergies  Allergen Reactions   Tall Ragweed Nausea Only    Social History   Tobacco Use   Smoking status:  Never   Smokeless tobacco: Never  Substance Use Topics   Alcohol use: Yes    Comment: occasional    No family history on file.   Review of Systems  Objective:  Physical Exam Vitals reviewed.  Constitutional:      Appearance: Normal appearance. He is obese.  HENT:     Head: Normocephalic and atraumatic.  Eyes:     Extraocular Movements: Extraocular movements intact.     Pupils: Pupils are equal, round, and reactive to light.  Cardiovascular:     Rate and Rhythm: Normal rate and regular rhythm.     Pulses: Normal pulses.  Pulmonary:     Effort: Pulmonary effort is normal.     Breath sounds: Normal breath sounds.  Abdominal:     Palpations: Abdomen is soft.  Musculoskeletal:     Cervical back: Normal range of motion and neck supple.     Left hip: Tenderness and bony tenderness present. Decreased range of motion. Decreased strength.  Neurological:     Mental Status: He is alert and oriented to person, place, and time.  Psychiatric:        Behavior: Behavior normal.     Vital signs in last 24 hours:    Labs:   Estimated body mass index is 41.11 kg/m as calculated from the following:   Height as of 07/30/22: 6\' 1"  (1.854 m).   Weight as of 07/30/22: 141.3 kg.   Imaging Review Plain radiographs and more so a left hip MRI demonstrates severe degenerative joint disease of  the left hip(s). The bone quality appears to be excellent for age and reported activity level.      Assessment/Plan:  End stage arthritis, left hip(s)  The patient history, physical examination, clinical judgement of the provider and imaging studies are consistent with end stage degenerative joint disease of the left hip(s) and total hip arthroplasty is deemed medically necessary. The treatment options including medical management, injection therapy, arthroscopy and arthroplasty were discussed at length. The risks and benefits of total hip arthroplasty were presented and reviewed. The risks due to  aseptic loosening, infection, stiffness, dislocation/subluxation,  thromboembolic complications and other imponderables were discussed.  The patient acknowledged the explanation, agreed to proceed with the plan and consent was signed. Patient is being admitted for inpatient treatment for surgery, pain control, PT, OT, prophylactic antibiotics, VTE prophylaxis, progressive ambulation and ADL's and discharge planning.The patient is planning to be discharged home with home health services

## 2022-08-05 ENCOUNTER — Other Ambulatory Visit: Payer: Self-pay

## 2022-08-05 ENCOUNTER — Ambulatory Visit (HOSPITAL_BASED_OUTPATIENT_CLINIC_OR_DEPARTMENT_OTHER): Payer: 59 | Admitting: Anesthesiology

## 2022-08-05 ENCOUNTER — Encounter (HOSPITAL_COMMUNITY)
Admission: RE | Disposition: A | Discharge status: Home Health | Payer: Self-pay | Source: Ambulatory Visit | Attending: Orthopaedic Surgery

## 2022-08-05 ENCOUNTER — Observation Stay (HOSPITAL_COMMUNITY): Payer: 59

## 2022-08-05 ENCOUNTER — Encounter (HOSPITAL_COMMUNITY): Payer: Self-pay | Admitting: Orthopaedic Surgery

## 2022-08-05 ENCOUNTER — Ambulatory Visit (HOSPITAL_COMMUNITY): Payer: 59

## 2022-08-05 ENCOUNTER — Ambulatory Visit (HOSPITAL_COMMUNITY): Payer: 59 | Admitting: Anesthesiology

## 2022-08-05 ENCOUNTER — Observation Stay (HOSPITAL_COMMUNITY)
Admission: RE | Admit: 2022-08-05 | Discharge: 2022-08-06 | Disposition: A | Discharge status: Home Health | Payer: 59 | Source: Ambulatory Visit | Attending: Orthopaedic Surgery | Admitting: Orthopaedic Surgery

## 2022-08-05 DIAGNOSIS — M1612 Unilateral primary osteoarthritis, left hip: Principal | ICD-10-CM | POA: Diagnosis present

## 2022-08-05 DIAGNOSIS — Z6841 Body Mass Index (BMI) 40.0 and over, adult: Secondary | ICD-10-CM | POA: Diagnosis not present

## 2022-08-05 DIAGNOSIS — Z96642 Presence of left artificial hip joint: Secondary | ICD-10-CM

## 2022-08-05 HISTORY — DX: Prediabetes: R73.03

## 2022-08-05 HISTORY — PX: TOTAL HIP ARTHROPLASTY: SHX124

## 2022-08-05 LAB — ABO/RH: ABO/RH(D): A POS

## 2022-08-05 SURGERY — ARTHROPLASTY, HIP, TOTAL, ANTERIOR APPROACH
Anesthesia: Spinal | Site: Hip | Laterality: Left

## 2022-08-05 MED ORDER — CHLORHEXIDINE GLUCONATE 0.12 % MT SOLN
OROMUCOSAL | Status: AC
  Administered 2022-08-05: 15 mL via OROMUCOSAL
  Filled 2022-08-05: qty 15

## 2022-08-05 MED ORDER — TRANEXAMIC ACID-NACL 1000-0.7 MG/100ML-% IV SOLN
1000.0000 mg | INTRAVENOUS | Status: AC
  Administered 2022-08-05: 1000 mg via INTRAVENOUS

## 2022-08-05 MED ORDER — MIDAZOLAM HCL 2 MG/2ML IJ SOLN
INTRAMUSCULAR | Status: DC | PRN
  Administered 2022-08-05: 2 mg via INTRAVENOUS

## 2022-08-05 MED ORDER — OXYCODONE HCL 5 MG/5ML PO SOLN: 5.0000 mg | Freq: Once | ORAL | Status: DC | PRN

## 2022-08-05 MED ORDER — SODIUM CHLORIDE 0.9 % IV SOLN: INTRAVENOUS | Status: DC

## 2022-08-05 MED ORDER — ONDANSETRON HCL 4 MG/2ML IJ SOLN: 4.0000 mg | Freq: Once | INTRAMUSCULAR | Status: DC | PRN

## 2022-08-05 MED ORDER — CEFAZOLIN IN SODIUM CHLORIDE 3-0.9 GM/100ML-% IV SOLN
INTRAVENOUS | Status: AC
  Filled 2022-08-05: qty 100

## 2022-08-05 MED ORDER — ONDANSETRON HCL 4 MG/2ML IJ SOLN
INTRAMUSCULAR | Status: AC
  Filled 2022-08-05: qty 2

## 2022-08-05 MED ORDER — HYDROMORPHONE HCL 1 MG/ML IJ SOLN: 0.2500 mg | INTRAMUSCULAR | Status: DC | PRN

## 2022-08-05 MED ORDER — ORAL CARE MOUTH RINSE: 15.0000 mL | Freq: Once | OROMUCOSAL | Status: AC

## 2022-08-05 MED ORDER — OXYCODONE HCL 5 MG PO TABS: 5.0000 mg | ORAL_TABLET | Freq: Once | ORAL | Status: DC | PRN

## 2022-08-05 MED ORDER — HYDROMORPHONE HCL 1 MG/ML IJ SOLN
0.5000 mg | INTRAMUSCULAR | Status: DC | PRN
  Administered 2022-08-06: 1 mg via INTRAVENOUS
  Filled 2022-08-05: qty 1

## 2022-08-05 MED ORDER — ACETAMINOPHEN 325 MG PO TABS
325.0000 mg | ORAL_TABLET | Freq: Four times a day (QID) | ORAL | Status: DC | PRN
  Administered 2022-08-05: 325 mg via ORAL
  Administered 2022-08-06: 650 mg via ORAL
  Filled 2022-08-05 (×2): qty 2

## 2022-08-05 MED ORDER — OXYCODONE HCL 5 MG PO TABS
10.0000 mg | ORAL_TABLET | ORAL | Status: DC | PRN
  Administered 2022-08-05 (×2): 15 mg via ORAL
  Filled 2022-08-05 (×2): qty 3

## 2022-08-05 MED ORDER — SODIUM CHLORIDE 0.9 % IR SOLN
Status: DC | PRN
  Administered 2022-08-05: 1000 mL

## 2022-08-05 MED ORDER — PHENYLEPHRINE 80 MCG/ML (10ML) SYRINGE FOR IV PUSH (FOR BLOOD PRESSURE SUPPORT)
PREFILLED_SYRINGE | INTRAVENOUS | Status: DC | PRN
  Administered 2022-08-05 (×3): 80 ug via INTRAVENOUS

## 2022-08-05 MED ORDER — CEFAZOLIN IN SODIUM CHLORIDE 3-0.9 GM/100ML-% IV SOLN
3.0000 g | INTRAVENOUS | Status: AC
  Administered 2022-08-05: 3 g via INTRAVENOUS

## 2022-08-05 MED ORDER — PANTOPRAZOLE SODIUM 40 MG PO TBEC
40.0000 mg | DELAYED_RELEASE_TABLET | Freq: Every day | ORAL | Status: DC
  Administered 2022-08-06: 40 mg via ORAL
  Filled 2022-08-05: qty 1

## 2022-08-05 MED ORDER — DIPHENHYDRAMINE HCL 12.5 MG/5ML PO ELIX: 12.5000 mg | ORAL_SOLUTION | ORAL | Status: DC | PRN

## 2022-08-05 MED ORDER — DROPERIDOL 2.5 MG/ML IJ SOLN: 0.6250 mg | Freq: Once | INTRAMUSCULAR | Status: DC | PRN

## 2022-08-05 MED ORDER — DOCUSATE SODIUM 100 MG PO CAPS
100.0000 mg | ORAL_CAPSULE | Freq: Two times a day (BID) | ORAL | Status: DC
  Administered 2022-08-05 – 2022-08-06 (×2): 100 mg via ORAL
  Filled 2022-08-05 (×2): qty 1

## 2022-08-05 MED ORDER — CHLORHEXIDINE GLUCONATE 0.12 % MT SOLN: 15.0000 mL | Freq: Once | OROMUCOSAL | Status: AC

## 2022-08-05 MED ORDER — CEFAZOLIN SODIUM-DEXTROSE 2-4 GM/100ML-% IV SOLN
2.0000 g | Freq: Four times a day (QID) | INTRAVENOUS | Status: AC
  Administered 2022-08-05 (×2): 2 g via INTRAVENOUS
  Filled 2022-08-05 (×2): qty 100

## 2022-08-05 MED ORDER — PHENOL 1.4 % MT LIQD: 1.0000 | OROMUCOSAL | Status: DC | PRN

## 2022-08-05 MED ORDER — MIDAZOLAM HCL 2 MG/2ML IJ SOLN
INTRAMUSCULAR | Status: AC
  Filled 2022-08-05: qty 2

## 2022-08-05 MED ORDER — OXYCODONE HCL 5 MG PO TABS
5.0000 mg | ORAL_TABLET | ORAL | Status: DC | PRN
  Administered 2022-08-05 – 2022-08-06 (×3): 10 mg via ORAL
  Filled 2022-08-05 (×3): qty 2

## 2022-08-05 MED ORDER — ALUM & MAG HYDROXIDE-SIMETH 200-200-20 MG/5ML PO SUSP: 30.0000 mL | ORAL | Status: DC | PRN

## 2022-08-05 MED ORDER — METOCLOPRAMIDE HCL 5 MG PO TABS: 5.0000 mg | ORAL_TABLET | Freq: Three times a day (TID) | ORAL | Status: DC | PRN

## 2022-08-05 MED ORDER — ASPIRIN 81 MG PO CHEW
81.0000 mg | CHEWABLE_TABLET | Freq: Two times a day (BID) | ORAL | Status: DC
  Administered 2022-08-05 – 2022-08-06 (×2): 81 mg via ORAL
  Filled 2022-08-05 (×2): qty 1

## 2022-08-05 MED ORDER — METHOCARBAMOL 500 MG PO TABS
500.0000 mg | ORAL_TABLET | Freq: Four times a day (QID) | ORAL | Status: DC | PRN
  Administered 2022-08-05 – 2022-08-06 (×3): 500 mg via ORAL
  Filled 2022-08-05 (×3): qty 1

## 2022-08-05 MED ORDER — METOCLOPRAMIDE HCL 5 MG/ML IJ SOLN: 5.0000 mg | Freq: Three times a day (TID) | INTRAMUSCULAR | Status: DC | PRN

## 2022-08-05 MED ORDER — ONDANSETRON HCL 4 MG/2ML IJ SOLN
INTRAMUSCULAR | Status: DC | PRN
  Administered 2022-08-05: 4 mg via INTRAVENOUS

## 2022-08-05 MED ORDER — 0.9 % SODIUM CHLORIDE (POUR BTL) OPTIME
TOPICAL | Status: DC | PRN
  Administered 2022-08-05: 1000 mL

## 2022-08-05 MED ORDER — ONDANSETRON HCL 4 MG/2ML IJ SOLN: 4.0000 mg | Freq: Four times a day (QID) | INTRAMUSCULAR | Status: DC | PRN

## 2022-08-05 MED ORDER — MENTHOL 3 MG MT LOZG: 1.0000 | LOZENGE | OROMUCOSAL | Status: DC | PRN

## 2022-08-05 MED ORDER — PROPOFOL 10 MG/ML IV BOLUS
INTRAVENOUS | Status: DC | PRN
  Administered 2022-08-05: 50 mg via INTRAVENOUS
  Administered 2022-08-05: 20 mg via INTRAVENOUS
  Administered 2022-08-05: 75 ug/kg/min via INTRAVENOUS

## 2022-08-05 MED ORDER — ONDANSETRON HCL 4 MG PO TABS: 4.0000 mg | ORAL_TABLET | Freq: Four times a day (QID) | ORAL | Status: DC | PRN

## 2022-08-05 MED ORDER — LACTATED RINGERS IV SOLN: INTRAVENOUS | Status: DC

## 2022-08-05 MED ORDER — BUPIVACAINE IN DEXTROSE 0.75-8.25 % IT SOLN
INTRATHECAL | Status: DC | PRN
  Administered 2022-08-05: 2 mL via INTRATHECAL

## 2022-08-05 MED ORDER — TAMSULOSIN HCL 0.4 MG PO CAPS
0.4000 mg | ORAL_CAPSULE | Freq: Every day | ORAL | Status: DC
  Administered 2022-08-05: 0.4 mg via ORAL
  Filled 2022-08-05: qty 1

## 2022-08-05 MED ORDER — TRANEXAMIC ACID-NACL 1000-0.7 MG/100ML-% IV SOLN
INTRAVENOUS | Status: AC
  Filled 2022-08-05: qty 100

## 2022-08-05 MED ORDER — PHENYLEPHRINE 80 MCG/ML (10ML) SYRINGE FOR IV PUSH (FOR BLOOD PRESSURE SUPPORT)
PREFILLED_SYRINGE | INTRAVENOUS | Status: AC
  Filled 2022-08-05: qty 10

## 2022-08-05 MED ORDER — METHOCARBAMOL 1000 MG/10ML IJ SOLN: 500.0000 mg | Freq: Four times a day (QID) | INTRAVENOUS | Status: DC | PRN

## 2022-08-05 SURGICAL SUPPLY — 56 items
ACETAB CUP W/GRIPTION 54 (Plate) ×1 IMPLANT
APL SKNCLS STERI-STRIP NONHPOA (GAUZE/BANDAGES/DRESSINGS) ×1
BAG COUNTER SPONGE SURGICOUNT (BAG) ×1 IMPLANT
BAG SPNG CNTER NS LX DISP (BAG) ×1
BENZOIN TINCTURE PRP APPL 2/3 (GAUZE/BANDAGES/DRESSINGS) ×1 IMPLANT
BLADE CLIPPER SURG (BLADE) IMPLANT
BLADE SAW SGTL 18X1.27X75 (BLADE) ×1 IMPLANT
COVER SURGICAL LIGHT HANDLE (MISCELLANEOUS) ×1 IMPLANT
CUP ACETAB W/GRIPTION 54 (Plate) IMPLANT
DRAPE C-ARM 42X72 X-RAY (DRAPES) ×1 IMPLANT
DRAPE STERI IOBAN 125X83 (DRAPES) ×1 IMPLANT
DRAPE U-SHAPE 47X51 STRL (DRAPES) ×3 IMPLANT
DRSG AQUACEL AG ADV 3.5X10 (GAUZE/BANDAGES/DRESSINGS) ×1 IMPLANT
DURAPREP 26ML APPLICATOR (WOUND CARE) ×1 IMPLANT
ELECT BLADE 4.0 EZ CLEAN MEGAD (MISCELLANEOUS) ×1
ELECT BLADE 6.5 EXT (BLADE) IMPLANT
ELECT REM PT RETURN 9FT ADLT (ELECTROSURGICAL) ×1
ELECTRODE BLDE 4.0 EZ CLN MEGD (MISCELLANEOUS) ×1 IMPLANT
ELECTRODE REM PT RTRN 9FT ADLT (ELECTROSURGICAL) ×1 IMPLANT
FACESHIELD WRAPAROUND (MASK) ×2 IMPLANT
FACESHIELD WRAPAROUND OR TEAM (MASK) ×2 IMPLANT
FEM STEM 12/14 TAPER SZ 4 HIP (Orthopedic Implant) ×1 IMPLANT
FEMORAL STEM 12/14 TPR SZ4 HIP (Orthopedic Implant) IMPLANT
GLOVE BIOGEL PI IND STRL 8 (GLOVE) ×2 IMPLANT
GLOVE ECLIPSE 8.0 STRL XLNG CF (GLOVE) ×1 IMPLANT
GLOVE ORTHO TXT STRL SZ7.5 (GLOVE) ×2 IMPLANT
GOWN STRL REUS W/ TWL LRG LVL3 (GOWN DISPOSABLE) ×2 IMPLANT
GOWN STRL REUS W/ TWL XL LVL3 (GOWN DISPOSABLE) ×2 IMPLANT
GOWN STRL REUS W/TWL LRG LVL3 (GOWN DISPOSABLE) ×2
GOWN STRL REUS W/TWL XL LVL3 (GOWN DISPOSABLE) ×2
HANDPIECE INTERPULSE COAX TIP (DISPOSABLE) ×1
HEAD CERAMIC 36 PLUS5 (Hips) IMPLANT
KIT BASIN OR (CUSTOM PROCEDURE TRAY) ×1 IMPLANT
KIT TURNOVER KIT B (KITS) ×1 IMPLANT
LINER NEUTRAL 54X36MM PLUS 4 (Hips) IMPLANT
MANIFOLD NEPTUNE II (INSTRUMENTS) ×1 IMPLANT
NS IRRIG 1000ML POUR BTL (IV SOLUTION) ×1 IMPLANT
PACK TOTAL JOINT (CUSTOM PROCEDURE TRAY) ×1 IMPLANT
PAD ARMBOARD 7.5X6 YLW CONV (MISCELLANEOUS) ×1 IMPLANT
SET HNDPC FAN SPRY TIP SCT (DISPOSABLE) ×1 IMPLANT
STAPLER VISISTAT 35W (STAPLE) IMPLANT
STRIP CLOSURE SKIN 1/2X4 (GAUZE/BANDAGES/DRESSINGS) ×2 IMPLANT
SUT ETHIBOND NAB CT1 #1 30IN (SUTURE) ×1 IMPLANT
SUT MNCRL AB 4-0 PS2 18 (SUTURE) IMPLANT
SUT VIC AB 0 CT1 27 (SUTURE) ×1
SUT VIC AB 0 CT1 27XBRD ANBCTR (SUTURE) ×1 IMPLANT
SUT VIC AB 1 CT1 27 (SUTURE) ×1
SUT VIC AB 1 CT1 27XBRD ANBCTR (SUTURE) ×1 IMPLANT
SUT VIC AB 2-0 CT1 27 (SUTURE) ×1
SUT VIC AB 2-0 CT1 TAPERPNT 27 (SUTURE) ×1 IMPLANT
TOWEL GREEN STERILE (TOWEL DISPOSABLE) ×1 IMPLANT
TOWEL GREEN STERILE FF (TOWEL DISPOSABLE) ×1 IMPLANT
TRAY CATH INTERMITTENT SS 16FR (CATHETERS) IMPLANT
TRAY FOLEY W/BAG SLVR 16FR (SET/KITS/TRAYS/PACK)
TRAY FOLEY W/BAG SLVR 16FR ST (SET/KITS/TRAYS/PACK) IMPLANT
WATER STERILE IRR 1000ML POUR (IV SOLUTION) ×2 IMPLANT

## 2022-08-05 NOTE — Anesthesia Preprocedure Evaluation (Signed)
Anesthesia Evaluation  Patient identified by MRN, date of birth, ID band Patient awake    Reviewed: Allergy & Precautions, NPO status , Patient's Chart, lab work & pertinent test results  History of Anesthesia Complications (+) PROLONGED EMERGENCE and history of anesthetic complications  Airway Mallampati: III       Dental no notable dental hx. (+) Caps, Dental Advisory Given, Teeth Intact   Pulmonary neg pulmonary ROS   Pulmonary exam normal breath sounds clear to auscultation       Cardiovascular negative cardio ROS Normal cardiovascular exam Rhythm:Regular Rate:Normal     Neuro/Psych negative neurological ROS  negative psych ROS   GI/Hepatic negative GI ROS, Neg liver ROS,,,  Endo/Other    Morbid obesity  Renal/GU negative Renal ROS  negative genitourinary   Musculoskeletal  (+) Arthritis , Osteoarthritis,  OA left hip   Abdominal  (+) + obese  Peds  Hematology negative hematology ROS (+)   Anesthesia Other Findings   Reproductive/Obstetrics                              Anesthesia Physical Anesthesia Plan  ASA: 3  Anesthesia Plan: Spinal   Post-op Pain Management:    Induction: Intravenous  PONV Risk Score and Plan: 2 and Propofol infusion and Treatment may vary due to age or medical condition  Airway Management Planned: Natural Airway and Simple Face Mask  Additional Equipment:   Intra-op Plan:   Post-operative Plan:   Informed Consent: I have reviewed the patients History and Physical, chart, labs and discussed the procedure including the risks, benefits and alternatives for the proposed anesthesia with the patient or authorized representative who has indicated his/her understanding and acceptance.     Dental advisory given  Plan Discussed with: CRNA and Anesthesiologist  Anesthesia Plan Comments:          Anesthesia Quick Evaluation

## 2022-08-05 NOTE — Plan of Care (Signed)
  Problem: Nutrition: Goal: Adequate nutrition will be maintained Outcome: Progressing   Problem: Pain Managment: Goal: General experience of comfort will improve Outcome: Progressing   Problem: Skin Integrity: Goal: Risk for impaired skin integrity will decrease Outcome: Progressing   

## 2022-08-05 NOTE — Interval H&P Note (Signed)
History and Physical Interval Note: The patient understands that he is here today for a left total hip replacement to treat his severe left hip arthritis.  There has been no acute or interval change in his medical status.  See H&P.  The risks and benefits of surgery have been explained in detail and informed consent has been obtained.  The left operative hip has been marked.  08/05/2022 8:57 AM  Daniel Huffman  has presented today for surgery, with the diagnosis of left hip osteoarthritis.  The various methods of treatment have been discussed with the patient and family. After consideration of risks, benefits and other options for treatment, the patient has consented to  Procedure(s): LEFT TOTAL HIP ARTHROPLASTY ANTERIOR APPROACH (Left) as a surgical intervention.  The patient's history has been reviewed, patient examined, no change in status, stable for surgery.  I have reviewed the patient's chart and labs.  Questions were answered to the patient's satisfaction.     Kathryne Hitch

## 2022-08-05 NOTE — TOC Initial Note (Signed)
Transition of Care [facility]) - Initial/Assessment Note    Patient Details  Name: Daniel Huffman MRN: [id number] Date of Birth: Aug 02, 1960  Transition of Care [facility]) CM/SW Contact:    Lawerance Sabal, RN Phone Number: 08/05/2022, 4:01 PM  Clinical Narrative:                  S/p L total hip From home w wife.   Spoke w patient and wife at bedside. Patient about to work w PT to assess if [facility] is needed. He states that he had his R hip done a few years ago and didn't need HH, so unsure if he will at this time. He does not have preference for [facility] agency if needed agreeable for CM to assess ratings and agency that will accept insurance and assign.  Patient has RW at home, discussed [facility] and ordered bari 3/1 through [employer] to be delivered to the room in the AM.  [facility] following  Anticipate patient will DC to home w wife's support.  Expected Discharge Plan: Home w Home Health Services Barriers to Discharge: Continued Medical Work up   Patient Goals and CMS Choice Patient states their goals for this hospitalization and ongoing recovery are:: to go home CMS Medicare.gov Compare Post Acute Care list provided to:: Patient Choice offered to / list presented to : Patient      Expected Discharge Plan and Services   Discharge Planning Services: CM Consult Post Acute Care Choice: Durable Medical Equipment Living arrangements for the past 2 months: Single Family Home                 DME Arranged: Bedside commode DME Agency: [employer] Date DME Agency Contacted: 08/05/22 Time DME Agency Contacted: [phone] Representative spoke with at DME Agency: Vaughan Basta            Prior Living Arrangements/Services Living arrangements for the past 2 months: Single Family Home Lives with:: Spouse              Current home services: DME    Activities of Daily Living      Permission Sought/Granted                  Emotional Assessment              Admission diagnosis:  Status post  total replacement of left hip [Z96.642] Patient Active Problem List   Diagnosis Date Noted   Status post total replacement of left hip 08/05/2022   Primary osteoarthritis of left hip 07/16/2022   PCP:  Rick Duff, PA-C Pharmacy:   [facility] [phone] - [place], [place] - [address] [place] [address] Phone: [phone] Fax: [phone]     Social Determinants of Health (SDOH) Social History: SDOH Screenings   Tobacco Use: Low Risk  (08/05/2022)   SDOH Interventions:     Readmission Risk Interventions     No data to display

## 2022-08-05 NOTE — Transfer of Care (Signed)
Immediate Anesthesia Transfer of Care Note  Patient: Daniel Huffman  Procedure(s) Performed: LEFT TOTAL HIP ARTHROPLASTY ANTERIOR APPROACH (Left: Hip)  Patient Location: PACU  Anesthesia Type:Spinal  Level of Consciousness: awake, alert , oriented, patient cooperative, and responds to stimulation  Airway & Oxygen Therapy: Patient Spontanous Breathing  Post-op Assessment: Report given to RN and Post -op Vital signs reviewed and stable  Post vital signs: Reviewed and stable  Last Vitals:  Vitals Value Taken Time  BP 96/68 08/05/22 1130  Temp    Pulse 91 08/05/22 1130  Resp 17 08/05/22 1130  SpO2 93 % 08/05/22 1130  Vitals shown include unvalidated device data.  Last Pain:  Vitals:   08/05/22 0754  TempSrc: Oral  PainSc: 0-No pain         Complications: No notable events documented.

## 2022-08-05 NOTE — Anesthesia Procedure Notes (Signed)
Spinal  Patient location during procedure: OR Start time: 08/05/2022 9:42 AM End time: 08/05/2022 9:45 AM Reason for block: surgical anesthesia Staffing Performed: anesthesiologist  Anesthesiologist: Mal Amabile, MD Performed by: Mal Amabile, MD Authorized by: Mal Amabile, MD   Preanesthetic Checklist Completed: patient identified, IV checked, site marked, risks and benefits discussed, surgical consent, monitors and equipment checked, pre-op evaluation and timeout performed Spinal Block Patient position: sitting Prep: DuraPrep and site prepped and draped Patient monitoring: heart rate, cardiac monitor, continuous pulse ox and blood pressure Approach: midline Location: L3-4 Injection technique: single-shot Needle Needle type: Pencan  Needle gauge: 24 G Needle length: 9 cm Needle insertion depth: 7 cm Assessment Sensory level: T6 Events: CSF return Additional Notes Patient tolerated procedure well. Adequate sensory level.

## 2022-08-05 NOTE — Anesthesia Postprocedure Evaluation (Signed)
Anesthesia Post Note  Patient: Daniel Huffman  Procedure(s) Performed: LEFT TOTAL HIP ARTHROPLASTY ANTERIOR APPROACH (Left: Hip)     Patient location during evaluation: PACU Anesthesia Type: Spinal Level of consciousness: oriented and awake and alert Pain management: pain level controlled Vital Signs Assessment: post-procedure vital signs reviewed and stable Respiratory status: spontaneous breathing, respiratory function stable and nonlabored ventilation Cardiovascular status: blood pressure returned to baseline and stable Postop Assessment: no headache, no backache, no apparent nausea or vomiting, spinal receding and patient able to bend at knees Anesthetic complications: no   No notable events documented.  Last Vitals:  Vitals:   08/05/22 1230 08/05/22 1300  BP: 108/72 114/74  Pulse: 61 60  Resp: 13 17  Temp: 37.1 C   SpO2: 99% 100%    Last Pain:  Vitals:   08/05/22 1130  TempSrc:   PainSc: 0-No pain                 Torian Thoennes A.

## 2022-08-05 NOTE — Op Note (Signed)
Operative Note  Date of operation: 08/05/2022 Preoperative diagnosis: Left hip primary osteoarthritis Postoperative diagnosis: Same  Procedure: Left direct anterior total hip arthroplasty  Implants: Implant Name Type Inv. Item Serial No. Manufacturer Lot No. LRB No. Used Action  LINER NEUTRAL [id number] PLUS 4 - [id number] Hips LINER NEUTRAL [id number] PLUS 4  [employer]PU[employer] [id number] Left 1 Implanted  ACETAB CUP W/GRIPTION 54 - [id number] Plate ACETAB CUP W/GRIPTION 54  [employer]PU[employer]TH[employer] [id number] Left 1 Implanted  FEM STEM 12/14 TAPER SZ 4 HIP - [id number] Orthopedic Implant FEM STEM 12/14 TAPER SZ 4 HIP  DEPUY ORTHOPAEDICS [id number] Left 1 Implanted  HEAD CERAMIC 36 PLUS5 - [id number] Hips HEAD CERAMIC 36 PLUS5  DEPUY ORTH[employer] [id number] Left 1 Implanted   Surgeon: Vanita Panda. Magnus Ivan, MD Assistant: Rexene Edison, PA-C  Anesthesia: Spinal EBL: 200 cc Antibiotics: 3 g IV Ancef Complications: None  Indications: The patient is an active 62 year old gentleman who is developed significant arthritis involving his left hip.  He has left hip pain on a daily basis and at this point it is 10 out of 10 pain that detrimentally affects his mobility, his quality life and his actives of daily living.  He does have remote history of a right hip replacement done through an anterior approach in 2013 and the hip is done very well.  He is an avid Firefighter and stays active.  His BMI is almost 41.  He understands that given his BMI there is a high risk of acute blood loss anemia, nerve vessel injury, fracture, infection, dislocation, DVT, implant failure, leg length differences and wound healing issues.  He understands her goals are hopefully decrease pain, improve mobility, and improve quality of life.  Procedure description: After informed consent was obtained and the appropriate left hip was marked, the patient was brought to the operating room and sat up on the stretcher where spinal anesthesia was  obtained.  He was then laid in supine position on stretcher and a Foley catheter was placed.  Traction boots were placed on both his feet and he was placed supine on the Hana fracture table with a perineal post in place in both legs and inline skeletal traction vices but no traction applied.  The left operative hip was prepped and draped with DuraPrep and sterile drapes.  Timeout is called and is identified as correct patient correct left hip.  Of note we did assess his hip radiographically as well before prepping and draping.  We then made incision just inferior and posterior to the ASIS and this was carried slightly obliquely down the leg.  Dissection was carried down the tensor fascia lata muscle and tensor fascia was then divided longitudinally to proceed with a direct interposed the hip.  Circumflex vessels were identified and cauterized.  The hip capsule was identified and opened up in L-type format.  Cobra retractors were placed around the medial and lateral femoral neck and a femoral neck cut was made just proximal to the lesser trochanter with an oscillating saw and completed with an osteotome.  A corkscrew guide was placed in the femoral head and the femoral head was removed in its entirety.  There was a wide area with significant cartilage wear.  A bent Hohmann was then placed over the medial acetabular rim and remnants of the acetabular labrum and other debris removed.  Reaming was then initiated from a size 43 reamer and stepwise increments going up to a size 53 reamer with all reamers placed on direct visualization and  the last reamer was placed under direct fluoroscopy in order to obtain the depth and reaming, the inclination and anteversion.  We then placed the real DePuy sector GRIPTION acetabular on a size 54 and a 36+4 polythene liner for that size 54 acetabular component.  Attention was then turned to the femur.  With the left leg externally rotated to 120 degrees, extended and adducted, a  Mueller retractor was placed medially and a Hohmann retractor was placed behind the greater trochanter.  A box cutting osteotome was used to enter the femoral canal and then broaching was initiated from a size 0 broach going and stepwise increments up to a size 4 broach.  We then trialed a high offset femoral neck and a 36+1.5 trial hip ball and reduce this into the acetabulum.  We assessed it radiographically and clinically we felt like we needed more leg length.  Of note he did start off shorter on the side.  We dislocated the hip and remove the trial components.  We then placed the real size 4 Actis femoral component with high offset and then the real 36+5 ceramic head ball.  Again this was reduced in the acetabulum and replaced a leg length, offset, range of motion and stability assessed both radiographically and mechanically.  We then irrigated the soft tissue normal saline solution.  The joint capsule was closed with interrupted #1 Ethibond suture followed by #1 Vicryl close the tensor fascia.  0 Vicryl was used to close the deep tissue and 2-0 Vicryl was used to close subcutaneous tissue.  Skin was closed with staples.  An Aquacel dressing was applied.  The patient was taken off on table and taken recovery in stable condition.  Rexene Edison, PA-C did assist during the entire case and beginning to end and his assistance was medically necessary and crucial for soft tissue management and retraction, helping guide implant placement and a layered closure of the wound.

## 2022-08-05 NOTE — Evaluation (Signed)
Physical Therapy Evaluation Patient Details Name: Daniel Huffman MRN: [id number] DOB: 04-12-1960 Today's Date: 08/05/2022  History of Present Illness  Pt is a 62 y.o. M who presents s/p L THA, direct anterior approach 08/05/2022. Significant PMH: R THA.  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 Pt presents with expected deficits in strength and gait post op. Able to perform bed level HEP for strengthening/ROM (written instruction provided). Took pivotal steps from bed to chair using RW at a min guard assist level. Further distance deferred due to pt report of dizziness; BP stable. Suspect steady progress tomorrow. Will benefit from further gait and stair training prior to d/c.     Recommendations for follow up therapy are one component of a multi-disciplinary discharge planning process, led by the attending physician.  Recommendations may be updated based on patient status, additional functional criteria and insurance authorization.  Follow Up Recommendations       Assistance Recommended at Discharge PRN  Patient can return home with the following  A little help with bathing/dressing/bathroom;Assistance with cooking/housework;Assist for transportation;Help with stairs or ramp for entrance    Equipment Recommendations BSC/3in1 (bariatric)  Recommendations for Other Services       Functional Status Assessment Patient has had a recent decline in their functional status and demonstrates the ability to make significant improvements in function in a reasonable and predictable amount of time.     Precautions / Restrictions Precautions Precautions: Fall Restrictions Weight Bearing Restrictions: Yes LLE Weight Bearing: Weight bearing as tolerated      Mobility  Bed Mobility Overal bed mobility: Needs Assistance Bed Mobility: Supine to Sit     Supine to sit: Min guard      General bed mobility comments: Cues for use of bed rail, min guard assist for safety, able to manuever LLE off of bed    Transfers Overall transfer level: Needs assistance Equipment used: Rolling walker (2 wheels) Transfers: Sit to/from Stand, Bed to chair/wheelchair/BSC Sit to Stand: Min guard   Step pivot transfers: Min guard       General transfer comment: Min guard for safety, cues for sequencing/technique with taking pivotal steps from bed to chair    Ambulation/Gait                  Stairs            Wheelchair Mobility    Modified Rankin (Stroke Patients Only)       Balance Overall balance assessment: Needs assistance Sitting-balance support: Feet supported Sitting balance-Leahy Scale: Good     Standing balance support: Bilateral upper extremity supported Standing balance-Leahy Scale: Fair                               Pertinent Vitals/Pain Pain Assessment Pain Assessment: Faces Faces Pain Scale: Hurts a little bit Pain Location: L hip Pain Descriptors / Indicators: Operative site guarding Pain Intervention(s): Limited activity within patient's tolerance, Monitored during session    Home Living Family/patient expects to be discharged to:: Private residence Living Arrangements: Spouse/significant other;Children Available Help at Discharge: Family Type of Home: House Home Access: Stairs to enter Entrance Stairs-Rails: Right Entrance Stairs-Number of Steps: 5 (from garage or front)   Home Layout: Able to live on main level with bedroom/bathroom Home Equipment: Agricultural consultant (2 wheels)  Prior Function Prior Level of Function : Independent/Modified Independent             Mobility Comments: works at [employer]; Tour manager        Extremity/Trunk Assessment   Upper Extremity Assessment Upper Extremity Assessment: Overall WFL for tasks assessed    Lower Extremity  Assessment Lower Extremity Assessment: LLE deficits/detail LLE Deficits / Details: s/p THA. Grossly 3-/5 strength       Communication   Communication: No difficulties  Cognition Arousal/Alertness: Awake/alert Behavior During Therapy: WFL for tasks assessed/performed Overall Cognitive Status: Within Functional Limits for tasks assessed                                          General Comments      Exercises Total Joint Exercises Ankle Circles/Pumps: Left, 10 reps, Supine Quad Sets: Left, 10 reps, Supine Heel Slides: Left, 5 reps, Supine Hip ABduction/ADduction: Left, 5 reps, Supine Straight Leg Raises: Left, 5 reps, Supine   Assessment/Plan    PT Assessment Patient needs continued PT services  PT Problem List Decreased strength;Decreased activity tolerance;Decreased balance;Decreased mobility;Pain       PT Treatment Interventions DME instruction;Gait training;Stair training;Functional mobility training;Therapeutic activities;Therapeutic exercise;Balance training;Patient/family education    PT Goals (Current goals can be found in the Care Plan section)  Acute Rehab PT Goals Patient Stated Goal: to return to baseline PT Goal Formulation: With patient Time For Goal Achievement: 08/19/22 Potential to Achieve Goals: Good    Frequency 7X/week     Co-evaluation               AM-PAC PT "6 Clicks" Mobility  Outcome Measure Help needed turning from your back to your side while in a flat bed without using bedrails?: None Help needed moving from lying on your back to sitting on the side of a flat bed without using bedrails?: A Little Help needed moving to and from a bed to a chair (including a wheelchair)?: A Little Help needed standing up from a chair using your arms (e.g., wheelchair or bedside chair)?: A Little Help needed to walk in hospital room?: A Little Help needed climbing 3-5 steps with a railing? : A Little 6 Click Score: 19    End of  Session Equipment Utilized During Treatment: Gait belt Activity Tolerance: Patient tolerated treatment well Patient left: with call bell/phone within reach;with family/visitor present;in chair Nurse Communication: Mobility status PT Visit Diagnosis: Difficulty in walking, not elsewhere classified (R26.2);Pain;Other abnormalities of gait and mobility (R26.89) Pain - Right/Left: Left Pain - part of body: Hip    Time: 1541-1620 PT Time Calculation (min) (ACUTE ONLY): 39 min   Charges:   PT Evaluation $PT Eval Low Complexity: 1 Low PT Treatments $Therapeutic Exercise: 8-22 mins $Therapeutic Activity: 8-22 mins        Lillia Pauls, PT, DPT Acute Rehabilitation Services Office [phone]   Norval Morton 08/05/2022, 4:41 PM

## 2022-08-06 ENCOUNTER — Encounter (HOSPITAL_COMMUNITY): Payer: Self-pay | Admitting: Orthopaedic Surgery

## 2022-08-06 DIAGNOSIS — M1612 Unilateral primary osteoarthritis, left hip: Secondary | ICD-10-CM | POA: Diagnosis not present

## 2022-08-06 LAB — BASIC METABOLIC PANEL
Anion gap: 4 — ABNORMAL LOW (ref 5–15)
BUN: 13 mg/dL (ref 8–23)
CO2: 26 mmol/L (ref 22–32)
Calcium: 8.2 mg/dL — ABNORMAL LOW (ref 8.9–10.3)
Chloride: 102 mmol/L (ref 98–111)
Creatinine, Ser: 1.14 mg/dL (ref 0.61–1.24)
GFR, Estimated: >60 mL/min (ref >60)
Glucose, Bld: 128 mg/dL — ABNORMAL HIGH (ref 70–99)
Potassium: 4.1 mmol/L (ref 3.5–5.1)
Sodium: 132 mmol/L — ABNORMAL LOW (ref 135–145)

## 2022-08-06 LAB — CBC
HCT: 38.5 % — ABNORMAL LOW (ref 39.0–52.0)
Hemoglobin: 12.6 g/dL — ABNORMAL LOW (ref 13.0–17.0)
MCH: 28.9 pg (ref 26.0–34.0)
MCHC: 32.7 g/dL (ref 30.0–36.0)
MCV: 88.3 fL (ref 80.0–100.0)
Platelets: 268 10*3/uL (ref 150–400)
RBC: 4.36 MIL/uL (ref 4.22–5.81)
RDW: 14.1 % (ref 11.5–15.5)
WBC: 12.4 10*3/uL — ABNORMAL HIGH (ref 4.0–10.5)
nRBC: 0 % (ref 0.0–0.2)

## 2022-08-06 MED ORDER — METHOCARBAMOL 500 MG PO TABS: 500.0000 mg | ORAL_TABLET | Freq: Four times a day (QID) | ORAL | 0 refills | Status: DC | PRN

## 2022-08-06 MED ORDER — ASPIRIN 81 MG PO CHEW: 81.0000 mg | CHEWABLE_TABLET | Freq: Two times a day (BID) | ORAL | 0 refills | Status: DC

## 2022-08-06 MED ORDER — OXYCODONE HCL 5 MG PO TABS: 5.0000 mg | ORAL_TABLET | ORAL | 0 refills | Status: DC | PRN

## 2022-08-06 NOTE — Progress Notes (Signed)
Pt has worked with PT and they have cleared him for discharge home.  Discharge instructions reviewed with pt.  Copy of instructions given to pt. Pt informed his scripts were sent to his pharmacy for pick up. Pt verbalized understanding of instructions.  Bariatric BSC has been delivered to pt's room and it will go home with pt. Wife here now to take pt home. Pt measured for TED hose and ordered for pt prior to discharge. Once hose arrive to unit pt will be discharged.  Pt to be d/c'd via wheelchair with belongings, with his wife.            To be escorted by staff.   Annice Needy, RN SWOT

## 2022-08-06 NOTE — Progress Notes (Signed)
Physical Therapy Treatment Patient Details Name: Daniel Huffman MRN: [id number] DOB: [birth year] Today's Date: 08/06/2022   History of Present Illness Pt is a 62 y.o. M who presents s/p L THA, direct anterior approach 08/05/2022. Significant PMH: R THA.    PT Comments    Pt received in supine and agreeable to session. Pt able to tolerate significantly increased gait distance and a stair trial this session with up to min guard for safety. Pt continuing to report dizziness with position changes, but states that it has improved from yesterday. Pt reporting having support of family and friends the next few days. Discussed safe home navigation and exercise progression. Anticipate pt and family will be able to manage pt's mobility needs at home.   Recommendations for follow up therapy are one component of a multi-disciplinary discharge planning process, led by the attending physician.  Recommendations may be updated based on patient status, additional functional criteria and insurance authorization.     Assistance Recommended at Discharge PRN  Patient can return home with the following A little help with bathing/dressing/bathroom;Assistance with cooking/housework;Assist for transportation;Help with stairs or ramp for entrance   Equipment Recommendations  BSC/3in1 (bariatric)    Recommendations for Other Services       Precautions / Restrictions Precautions Precautions: Fall Restrictions Weight Bearing Restrictions: Yes LLE Weight Bearing: Weight bearing as tolerated     Mobility  Bed Mobility Overal bed mobility: Needs Assistance Bed Mobility: Supine to Sit     Supine to sit: Supervision, HOB elevated     General bed mobility comments: Supervision with use of gait belt to advance LLE    Transfers Overall transfer level: Needs assistance Equipment used: Rolling walker (2 wheels) Transfers: Sit to/from Stand Sit to Stand: Supervision           General transfer comment: Cues  for hand placement and pt demonstrating steady power up.    Ambulation/Gait Ambulation/Gait assistance: Supervision Gait Distance (Feet): 150 Feet Assistive device: Rolling walker (2 wheels) Gait Pattern/deviations: Step-through pattern     Pre-gait activities: weight shifting General Gait Details: Pt demonstrating a steady step-through pattern with supervision for safety.   Stairs Stairs: Yes Stairs assistance: Min guard Stair Management: One rail Right, Forwards Number of Stairs: 4 General stair comments: Pt able to perform stair trial with min guard for safety and cues for sequence and technique. No evidence of unsteadiness of overt LOB.      Balance Overall balance assessment: Needs assistance Sitting-balance support: Feet supported Sitting balance-Leahy Scale: Good Sitting balance - Comments: sitting EOB   Standing balance support: Bilateral upper extremity supported, During functional activity Standing balance-Leahy Scale: Fair Standing balance comment: with RW support                            Cognition Arousal/Alertness: Awake/alert Behavior During Therapy: WFL for tasks assessed/performed Overall Cognitive Status: Within Functional Limits for tasks assessed                                          Exercises      General Comments General comments (skin integrity, edema, etc.): Pt reporting dizziness in sitting and standing, however reporting that is is "better than yesterday". BP in sitting: 131/92; BP in standing: 120/87      Pertinent Vitals/Pain Pain Assessment Pain Assessment: 0-10 Pain Score: 4  Pain Location: L hip Pain Descriptors / Indicators: Operative site guarding, Aching Pain Intervention(s): Monitored during session, Repositioned     PT Goals (current goals can now be found in the care plan section) Acute Rehab PT Goals Patient Stated Goal: to return to baseline PT Goal Formulation: With patient Time For  Goal Achievement: 08/19/22 Potential to Achieve Goals: Good Progress towards PT goals: Progressing toward goals    Frequency    7X/week      PT Plan Current plan remains appropriate       AM-PAC PT "6 Clicks" Mobility   Outcome Measure  Help needed turning from your back to your side while in a flat bed without using bedrails?: None Help needed moving from lying on your back to sitting on the side of a flat bed without using bedrails?: A Little Help needed moving to and from a bed to a chair (including a wheelchair)?: A Little Help needed standing up from a chair using your arms (e.g., wheelchair or bedside chair)?: A Little Help needed to walk in hospital room?: A Little Help needed climbing 3-5 steps with a railing? : A Little 6 Click Score: 19    End of Session Equipment Utilized During Treatment: Gait belt Activity Tolerance: Patient tolerated treatment well Patient left: in chair;with call bell/phone within reach Nurse Communication: Mobility status PT Visit Diagnosis: Difficulty in walking, not elsewhere classified (R26.2);Pain;Other abnormalities of gait and mobility (R26.89) Pain - Right/Left: Left Pain - part of body: Hip     Time: 1610-9604 PT Time Calculation (min) (ACUTE ONLY): 37 min  Charges:  $Gait Training: 23-37 mins                     Daniel Huffman, PTA Acute Rehabilitation Services Secure Chat Preferred  [phone]    Daniel Huffman 08/06/2022, 9:44 AM

## 2022-08-06 NOTE — TOC Transition Note (Signed)
Transition of Care [facility]) - CM/SW Discharge Note   Patient Details  Name: Daniel Huffman MRN: [id number] Date of Birth: June 16, 1960  [facility]) CM/SW Contact:  Lawerance Sabal, RN Phone Number: 08/06/2022, 8:36 AM   Clinical Narrative:     Referral accepted by [facility] liaison, Amy.  Bari BSC to be delivered to the room this morning.  Wife to transport. No other TOC needs identified.   Final next level of care: Home w Home Health Services Barriers to Discharge: No Barriers Identified   Patient Goals and CMS Choice CMS Medicare.gov Compare Post Acute Care list provided to:: Patient Choice offered to / list presented to : Patient  Discharge Placement                         Discharge Plan and Services Additional resources added to the After Visit Summary for     Discharge Planning Services: CM Consult Post Acute Care Choice: Durable Medical Equipment          DME Arranged: Bedside commode DME Agency: [employer] Date DME Agency Contacted: 08/05/22 Time DME Agency Contacted: 1601 Representative spoke with at DME Agency: Vaughan Basta HH Arranged: PT HH Agency: [facility] Date [facility] Agency Contacted: 08/06/22 Time HH Agency Contacted: [phone] Representative spoke with at [facility] Agency: Amy  Social Determinants of Health (SDOH) Interventions SDOH Screenings   Tobacco Use: Low Risk  (08/06/2022)     Readmission Risk Interventions     No data to display

## 2022-08-06 NOTE — Discharge Summary (Signed)
Patient ID: Daniel Huffman MRN: [id number] DOB/AGE: [birth year] 62 y.o.  Admit date: 08/05/2022 Discharge date: 08/06/2022  Admission Diagnoses:  Principal Problem:   Status post total replacement of left hip Active Problems:   Primary osteoarthritis of left hip   Discharge Diagnoses:  Same  Past Medical History:  Diagnosis Date   Complication of anesthesia    Had trouble waking with R hip surgery at [facility] 08/23/09   Osteoarthritis 2011   R & L hip   Pre-diabetes     Surgeries: Procedure(s): LEFT TOTAL HIP ARTHROPLASTY ANTERIOR APPROACH on 08/05/2022   Consultants:   Discharged Condition: Improved  Hospital Course: Daniel Huffman is an 62 y.o. male who was admitted 08/05/2022 for operative treatment ofStatus post total replacement of left hip. Patient has severe unremitting pain that affects sleep, daily activities, and work/hobbies. After pre-op clearance the patient was taken to the operating room on 08/05/2022 and underwent  Procedure(s): LEFT TOTAL HIP ARTHROPLASTY ANTERIOR APPROACH.    Patient was given perioperative antibiotics:  Anti-infectives (From admission, onward)    Start     Dose/Rate Route Frequency Ordered Stop   08/05/22 1600  ceFAZolin (ANCEF) IVPB 2g/100 mL premix        2 g 200 mL/hr over 30 Minutes Intravenous Every 6 hours 08/05/22 1234 08/06/22 1243   08/05/22 0900  ceFAZolin (ANCEF) IVPB 3g/100 mL premix        3 g 200 mL/hr over 30 Minutes Intravenous On call to O.R. 08/05/22 0742 08/05/22 1018   08/05/22 0745  ceFAZolin (ANCEF) 3-0.9 GM/100ML-% IVPB       Note to Pharmacy: Camillo Flaming: cabinet override      08/05/22 0745 08/05/22 0950        Patient was given sequential compression devices, early ambulation, and chemoprophylaxis to prevent DVT.  Patient benefited maximally from hospital stay and there were no complications.    Recent vital signs: Patient Vitals for the past 24 hrs:  BP Temp Temp src Pulse Resp SpO2  08/06/22 0845 128/76 98 F  (36.7 C) Oral 91 18 99 %  08/06/22 0347 123/68 98.2 F (36.8 C) -- 98 17 95 %  08/06/22 0002 136/72 98.5 F (36.9 C) -- 98 17 99 %  08/05/22 2048 134/78 98 F (36.7 C) -- 97 17 99 %  08/05/22 1530 109/73 98.2 F (36.8 C) Axillary 74 14 100 %     Recent laboratory studies:  Recent Labs    08/06/22 0422  WBC 12.4*  HGB 12.6*  HCT 38.5*  PLT 268  NA 132*  K 4.1  CL 102  CO2 26  BUN 13  CREATININE 1.14  GLUCOSE 128*  CALCIUM 8.2*     Discharge Medications:   Allergies as of 08/06/2022       Reactions   Tall Ragweed Nausea Only        Medication List     TAKE these medications    aspirin 81 MG chewable tablet Chew 1 tablet (81 mg total) by mouth 2 (two) times daily.   celecoxib 200 MG capsule Commonly known as: CeleBREX Take 1 capsule (200 mg total) by mouth daily.   methocarbamol 500 MG tablet Commonly known as: ROBAXIN Take 1 tablet (500 mg total) by mouth every 6 (six) hours as needed for muscle spasms.   Nasacort Allergy 24HR 55 MCG/ACT Aero nasal inhaler Generic drug: triamcinolone Place 2 sprays into the nose daily.   oxyCODONE 5 MG immediate release tablet Commonly known as: Oxy  IR/ROXICODONE Take 1-2 tablets (5-10 mg total) by mouth every 4 (four) hours as needed for moderate pain (pain score 4-6).               Durable Medical Equipment  (From admission, onward)           Start     Ordered   08/05/22 1558  For home use only DME Bedside commode  Once       Comments: Bariatric needed due to body habitus Weight 308 pounds Height 6'1"  Question:  Patient needs a bedside commode to treat with the following condition  Answer:  Weakness   08/05/22 1559   08/05/22 1235  DME 3 n 1  Once        08/05/22 1234   08/05/22 1235  DME Walker rolling  Once       Question Answer Comment  Walker: With 5 Inch Wheels   Patient needs a walker to treat with the following condition Status post total replacement of left hip      08/05/22 1234             Diagnostic Studies: DG Pelvis Portable  Result Date: 08/05/2022 CLINICAL DATA:  Status post left hip replacement. EXAM: PORTABLE PELVIS 1-2 VIEWS COMPARISON:  Fluoroscopic images of same day. FINDINGS: Left femoral and acetabular components are well situated. Expected postoperative changes seen in the surrounding soft tissues. IMPRESSION: Interval left total hip arthroplasty placement. Electronically Signed   By: Lupita Raider M.D.   On: 08/05/2022 12:25   DG HIP UNILAT WITH PELVIS 1V LEFT  Result Date: 08/05/2022 CLINICAL DATA:  [id number] Elective surgery [id number] EXAM: DG HIP (WITH OR WITHOUT PELVIS) 1V*L* COMPARISON:  MRI 05/30/2022 FINDINGS: Intraoperative images during left hip arthroplasty. Normal alignment. No evidence of loosening or periprosthetic fracture. Expected soft tissue changes. IMPRESSION: Intraoperative images during left hip arthroplasty. Normal alignment. No evidence of immediate hardware complication. Electronically Signed   By: Caprice Renshaw M.D.   On: 08/05/2022 11:13   DG C-Arm 1-60 Min-No Report  Result Date: 08/05/2022 Fluoroscopy was utilized by the requesting physician.  No radiographic interpretation.    Disposition: Discharge disposition: 01-Home or Self Care          Follow-up Information     Kathryne Hitch, MD Follow up in 2 week(s).   Specialty: Orthopedic Surgery Contact information: [address] [place] [address] [phone]         [facility] Follow up.   Why: for home health services Contact information: [address] [place] [address] [phone]                  Signed: Kathryne Hitch 08/06/2022, 1:53 PM

## 2022-08-06 NOTE — Progress Notes (Signed)
Subjective: 1 Day Post-Op Procedure(s) (LRB): LEFT TOTAL HIP ARTHROPLASTY ANTERIOR APPROACH (Left) Patient reports pain as moderate.    Objective: Vital signs in last 24 hours: Temp:  [98 F (36.7 C)-98.8 F (37.1 C)] 98.2 F (36.8 C) (05/22 0347) Pulse Rate:  [60-98] 98 (05/22 0347) Resp:  [13-20] 17 (05/22 0347) BP: (91-146)/(67-81) 123/68 (05/22 0347) SpO2:  [93 %-100 %] 95 % (05/22 0347) Weight:  [140.6 kg] 140.6 kg (05/21 0754)  Intake/Output from previous day: 05/21 0701 - 05/22 0700 In: 1755.1 [I.V.:1555.1; IV Piggyback:200] Out: 700 [Urine:500; Blood:200] Intake/Output this shift: No intake/output data recorded.  Recent Labs    08/06/22 0422  HGB 12.6*   Recent Labs    08/06/22 0422  WBC 12.4*  RBC 4.36  HCT 38.5*  PLT 268   Recent Labs    08/06/22 0422  NA 132*  K 4.1  CL 102  CO2 26  BUN 13  CREATININE 1.14  GLUCOSE 128*  CALCIUM 8.2*   No results for input(s): "LABPT", "INR" in the last 72 hours.  Sensation intact distally Intact pulses distally Dorsiflexion/Plantar flexion intact Incision: dressing C/D/I   Assessment/Plan: 1 Day Post-Op Procedure(s) (LRB): LEFT TOTAL HIP ARTHROPLASTY ANTERIOR APPROACH (Left) Up with therapy Discharge home with home health this afternoon.      Kathryne Hitch 08/06/2022, 7:38 AM

## 2022-08-06 NOTE — Plan of Care (Signed)

## 2022-08-06 NOTE — Discharge Instructions (Signed)

## 2022-08-08 ENCOUNTER — Telehealth: Payer: Self-pay | Admitting: Orthopaedic Surgery

## 2022-08-08 NOTE — Telephone Encounter (Signed)
[name] (PT) called from [facility] requesting verbal orders for 1 wk 1, 2wk 1, and 1 wk 1. [name] secure number is [phone].

## 2022-08-08 NOTE — Telephone Encounter (Signed)
Called and gave verbal ok

## 2022-08-15 ENCOUNTER — Telehealth: Payer: Self-pay | Admitting: Orthopaedic Surgery

## 2022-08-15 NOTE — Telephone Encounter (Signed)
Patient advises that he will take the 3:15 appointment time on Monday.

## 2022-08-18 ENCOUNTER — Encounter: Payer: Self-pay | Admitting: Orthopaedic Surgery

## 2022-08-18 ENCOUNTER — Ambulatory Visit (INDEPENDENT_AMBULATORY_CARE_PROVIDER_SITE_OTHER): Payer: 59 | Admitting: Orthopaedic Surgery

## 2022-08-18 DIAGNOSIS — Z96642 Presence of left artificial hip joint: Secondary | ICD-10-CM

## 2022-08-18 MED ORDER — OXYCODONE HCL 5 MG PO TABS: 5.0000 mg | ORAL_TABLET | Freq: Four times a day (QID) | ORAL | 0 refills | Status: DC | PRN

## 2022-08-18 NOTE — Progress Notes (Signed)
The patient is here for his first postoperative visit status post a left total hip arthroplasty.  He is 62 years old.  He had his right hip replaced elsewhere in 2013.  He feels like his leg lengths are improved and are on.  He was shortened prior to surgery.  He is ambulating with a cane.  He does need a refill of pain medication but he is using this sparingly.  He has been compliant with wearing his TED hose and taking a baby aspirin twice a day.  Denies any calf pain denies foot and ankle swelling.  His wife is with him today as well.  On exam his left operative hip looks good.  I remove the staples in place Steri-Strips.  His calf is soft.  There is no significant seroma.  Will see him back in 6 weeks to make sure he is doing well overall.  If he does have any issues before then he will let us know.  I did refill his oxycodone which she is already weaning from.  All questions concerns were addressed and answered.

## 2022-09-09 ENCOUNTER — Other Ambulatory Visit: Payer: Self-pay | Admitting: Physician Assistant

## 2022-09-29 ENCOUNTER — Encounter: Payer: Self-pay | Admitting: Orthopaedic Surgery

## 2022-09-29 ENCOUNTER — Ambulatory Visit: Payer: 59 | Admitting: Orthopaedic Surgery

## 2022-09-29 DIAGNOSIS — Z96642 Presence of left artificial hip joint: Secondary | ICD-10-CM

## 2022-09-29 NOTE — Progress Notes (Signed)
Daniel Huffman is now 8 weeks status post a left total hip replacement.  He says doing well but not 100% just yet.  He says sometimes he feels like it is nonpositional he walks but that sensation goes away.  He says Celebrex does help with the inflammation.  When he runs low he knows to call us to have more sent in.  He is walking without an assistive ice.  When I have him lay supine his leg lengths are equal.  He tolerates me easily putting both his hips the range of motion.  He has remote history of a right hip replacement.  He can try any ointments or creams on his leg.  He can increase his workout activities as comfort allows.  The next time I need to see him is not for 6 months unless he has issues.  Will have a standing low AP pelvis and lateral of his more recent left operative hip at that visit.  All questions and concerns were addressed and answered.

## 2022-10-24 ENCOUNTER — Encounter: Payer: Self-pay | Admitting: Orthopaedic Surgery

## 2022-10-30 ENCOUNTER — Telehealth: Payer: Self-pay | Admitting: Orthopaedic Surgery

## 2022-10-30 MED ORDER — AMOXICILLIN 500 MG PO TABS: ORAL_TABLET | ORAL | 0 refills | Status: AC

## 2022-10-30 NOTE — Telephone Encounter (Signed)
Patient is going to the dentist today and will need amoxicillin please advise

## 2022-10-30 NOTE — Telephone Encounter (Signed)
Pt is still within 3 month time frame. This was sent to the pharmacy. Pt was called and informed

## 2023-04-01 ENCOUNTER — Ambulatory Visit: Payer: 59 | Admitting: Orthopaedic Surgery

## 2023-05-18 ENCOUNTER — Ambulatory Visit: Payer: BLUE CROSS/BLUE SHIELD

## 2023-05-18 ENCOUNTER — Inpatient Hospital Stay: Payer: BLUE CROSS/BLUE SHIELD

## 2023-05-18 DIAGNOSIS — I2510 Atherosclerotic heart disease of native coronary artery without angina pectoris: Secondary | ICD-10-CM

## 2023-05-18 DIAGNOSIS — E782 Mixed hyperlipidemia: Secondary | ICD-10-CM

## 2023-05-18 DIAGNOSIS — G8929 Other chronic pain: Secondary | ICD-10-CM

## 2023-05-18 DIAGNOSIS — M542 Cervicalgia: Secondary | ICD-10-CM

## 2023-05-18 DIAGNOSIS — Z1211 Encounter for screening for malignant neoplasm of colon: Secondary | ICD-10-CM

## 2023-05-18 DIAGNOSIS — K319 Disease of stomach and duodenum, unspecified: Secondary | ICD-10-CM

## 2023-05-18 DIAGNOSIS — I10 Essential (primary) hypertension: Secondary | ICD-10-CM

## 2023-05-18 DIAGNOSIS — H53131 Sudden visual loss, right eye: Secondary | ICD-10-CM

## 2023-05-18 DIAGNOSIS — E119 Type 2 diabetes mellitus without complications: Secondary | ICD-10-CM

## 2023-05-18 NOTE — Progress Notes
 Patient: Kyle David  Date of Birth: [birth year]  [place] MRN: [id number]  PCP: Charlsie Quest, MD     SUBJECTIVE:   [phone] with CAD here to establish care  Here to discuss the following:    Kidney function test  Has upcoming coronary CT with contrast  Needs serum creatinine  No cardiac symptoms    2.   Med rec    3.  Diabetes  [place] A1c 6.5 at outside labs (03/2023)    4.   Vision loss  Sudden vision loss 1 week ago  L lower visual field only  Lasted 3-4 minutes  Occurred with position change of the head/neck  Has never had this before  History of neck injury in childhood and again in 2004  Associated with recurrent pain that is positional  No peripheral numbness, weakness, tingling  No gait instability    5.  Gastric lesions  History of ''pre-cancerous'' lesion found on endoscopy at outside facility  Lost to follow up  No GI symptoms  No consitutional symptoms    Patient Active Problem List   Diagnosis    High blood pressure    Hyperlipemia    History of myocardial infarction    Diabetes mellitus (HCC/RAF)    CAD (coronary artery disease), native coronary artery     Past Medical History:   Diagnosis Date    Concussion     age 63 yrs, skill fx     Hyperlipidemia     Hypertension     Myocardial infarction (HCC/RAF) 2009     No past surgical history on file.  Outpatient Medications Prior to Visit   Medication Sig Dispense Refill    amlodipine 10 mg tablet Take 1 tablet (10 mg total) by mouth daily.      aspirin 81 mg EC tablet Take 1 tablet (81 mg total) by mouth daily.      atorvastatin 40 mg tablet Take 2 tablets (80 mg total) by mouth daily.      lisinopril 10 mg tablet Take by mouth.      metFORMIN 500 mg tablet metformin 500 mg tablet      metoprolol succinate 25 mg 24 hr tablet take 1/2 tablet by mouth twice a day      amiodarone 100 mg tablet Take by mouth.      clopidogrel (PLAVIX) 75 mg tablet Take by mouth.      hydroCHLOROthiazide 12.5 MG tablet 1 tablet (12.5 mg total).      lisinopril 10 mg tablet Take 1 tablet by mouth daily      metoprolol succinate (TOPROL XL) 25 mg 24 hr tablet Take by mouth.      ranolazine 1000 mg 12 hr tablet Take 500 mg by mouth.      sodium sulfate-potassium sulfate-magnesium sulfate (SUPREP BOWEL PREP KIT) solution Take as directed by your doctor for colonoscopy. 354 mL 0     No facility-administered medications prior to visit.     No Known Allergies  Family History   Adopted: Yes   Problem Relation Age of Onset    Anesthesia problems Neg Hx     Malignant hypertension Neg Hx     Hypotension Neg Hx     Malignant hyperthermia Neg Hx      Social History     Socioeconomic History    Marital status: Married   Tobacco Use    Smoking status: Never     Passive exposure: Never    Smokeless tobacco: Never  Substance and Sexual Activity    Alcohol use: No     Alcohol/week: 0.0 oz    Drug use: No    Sexual activity: Never   Other Topics Concern    Do you exercise at least a day, 3 or more days a week? Yes    Types of Exercise? (List in Comments) Yes    Do you follow a special diet? Yes    Vegan? No    Vegetarian? No    Pescatarian? No    Lactose Free? No    Gluten Free? No    Omnivore? No       OBJECTIVE:   Vitals Signs  BP 127/72  ~ Pulse 60  ~ Temp 36.3 ?C (97.4 ?F) (Tympanic)  ~ Ht 6' 1'' (1.854 m)  ~ Wt 189 lb 9.6 oz (86 kg)  ~ SpO2 96%  ~ BMI 25.01 kg/m?  Body mass index is 25.01 kg/m?Marland Kitchen    Physical Exam  GEN: alert, oriented, no acute distress  HEENT: normocephalic, atraumatic, EOMI, no conjunctival pallor, TMs intact without bulging or erythema, moist mucus membranes, no pharyngeal erythema, no tonsillar exudates, neck supple, no cervical lymphadenopathy  CV: regular rate, no murmurs/gallops/rubs appreciated  RESP: normal respiratory effort, no wheezes/rales/rhonchi appreciated  GI: normoactive bowel sounds, soft, non-tender, no guarding, no rigidity  MSK: no gross deformities, normal gait  NEURO: CN II-XII grossly intact, motor strength intact throughout, cerebellar function intact  SKIN: no rashes or lesions noted  PSYCH: normal affect, appropriate responses, logical, linear, good insight    ASSESSMENT & PLAN:   1. Sudden visual loss of right eye (Primary)  Neurologically stable   No concerning findings on exam  Recommend urgent CT brain/neck  ED precautions given  - CT brain angiogram wo+w contrast; Future  - CT neck angiogram w contrast; Future    2. Coronary artery disease involving native coronary artery of native heart without angina pectoris  No cardiac symptoms  Will obtain BMP as requested  - Basic Metabolic Panel; Future    3. Gastric lesion  - Referral to Gastroenterology    4. Chronic neck pain  - XR cervical spine ap+lat+odontoid (3 views); Future    5. Type 2 diabetes mellitus without complication, without long-term current use of insulin (HCC/RAF)  Chronic, continue current regimen    6. Mixed hyperlipidemia  Chronic, continue current regimen    7. Primary hypertension  Chronic, continue current regimen    8. Colon cancer screening  Chronic, continue current regimen  - Referral to Gastroenterology

## 2023-05-19 ENCOUNTER — Inpatient Hospital Stay: Payer: BLUE CROSS/BLUE SHIELD

## 2023-05-19 DIAGNOSIS — H53131 Sudden visual loss, right eye: Secondary | ICD-10-CM

## 2023-05-19 MED ADMIN — IOHEXOL 350 MG/ML IV SOLN: 100 mL | INTRAVENOUS | @ 18:00:00 | Stop: 2023-05-19 | NDC 00407141491

## 2023-05-22 ENCOUNTER — Other Ambulatory Visit: Payer: PRIVATE HEALTH INSURANCE

## 2023-05-22 ENCOUNTER — Ambulatory Visit: Payer: PRIVATE HEALTH INSURANCE

## 2023-05-22 DIAGNOSIS — I779 Disorder of arteries and arterioles, unspecified: Secondary | ICD-10-CM

## 2023-05-22 DIAGNOSIS — H53131 Sudden visual loss, right eye: Secondary | ICD-10-CM

## 2023-05-26 ENCOUNTER — Telehealth: Payer: PRIVATE HEALTH INSURANCE

## 2023-05-26 NOTE — Telephone Encounter
 Spoke with patient and appointment has been scheduled

## 2023-05-26 NOTE — Telephone Encounter
 Call Back Request      Reason for call back: Patient calling to schedule /urgent referral in CC    Any Symptoms:  []  Yes  [x]  No      If yes, what symptoms are you experiencing:    Duration of symptoms (how long):    Have you taken medication for symptoms (OTC or Rx):      If call was taken outside of clinic hours:    [] Patient or caller has been notified that this message was sent outside of normal clinic hours.     [] Patient or caller has been warm transferred to the physician's answering service. If applicable, patient or caller informed to please call us back if symptoms progress.  Patient or caller has been notified of the turnaround time of 1-2 business day(s).

## 2023-06-16 ENCOUNTER — Ambulatory Visit: Payer: BLUE CROSS/BLUE SHIELD

## 2023-06-16 DIAGNOSIS — I7090 Unspecified atherosclerosis: Secondary | ICD-10-CM

## 2023-06-16 NOTE — Consults
 [facility] [employer] [facility]    Consultation Note     Primary Care Physician: Jeneen Mire, MD  Referring Practitioner: No ref. provider found    Patient: Kyle David   MRN: [id number]  DOB: April 21, 1960  Date of Service: 06/16/2023     Verbal consent was obtained from the patient to proceed with the telemedicine consultation via a secure, HIPAA compliant web interface. Patient expressed full understanding that the interaction will remain strictly confidential and that the patient has the right to stop the session immediately, if he wishes.       Neurosurgery Telemedicine Consultation Note     Chief Complaint:   1. Atherosclerosis          HPI:  Kyle David is a 63 y.o. very pleasant man who reports that he was driving, with sudden onset of vision loss in the right eye (described as ''completely white for bottom half of vision in the right eye''), that has since self resolved. He then presented to his PCP who advised him to obtain a CTA of the head and neck, which was notable for vascular calcifications of the descending thoracic aorta, right carotid bulb, as well as the cavernous and ophthalmic segments of the bilateral internal carotid arteries and right V4 segment. Given imaging findings, he was advised to consult with Neurosurgery. I had the pleasure of speaking with the patient via telemedicine conference for further evaluation and discussion of treatment options.       Past Medical History  Past Medical History:   Diagnosis Date    Concussion     age 65 yrs, skill fx     Hyperlipidemia     Hypertension     Myocardial infarction (HCC/RAF) 2009       Past Surgical History  No past surgical history on file.      Physical Exam:   Ht 6' (1.829 m)  ~ Wt 183 lb (83 kg)  ~ BMI 24.82 kg/m?   General: No acute distress.  Neurological: Awake, attentive and interactive.   Language: Speech production and comprehension intact      Medications  Medications that the patient states to be currently taking   Medication Sig    amlodipine 10 mg tablet Take 1 tablet (10 mg total) by mouth daily.    aspirin 81 mg EC tablet Take 1 tablet (81 mg total) by mouth daily.    atorvastatin 40 mg tablet Take 2 tablets (80 mg total) by mouth daily.    lisinopril 10 mg tablet Take by mouth.    metFORMIN 500 mg tablet metformin 500 mg tablet    metoprolol succinate 25 mg 24 hr tablet take 1/2 tablet by mouth twice a day         Review of Imaging:    CTA: I have personally reviewed the CTA of the head and neck dated 05/19/2023 and reviewed it with the patient. This demonstrates:    IMPRESSION:     No hemodynamically-significant focal stenosis.     Vascular calcifications of the descending thoracic aorta, right carotid bulb, as well as the cavernous and ophthalmic segments of the bilateral internal carotid arteries and right V4 segment.     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    Assessment:   Atherosclerosis:     I reviewed the patient's most recent CTA of the head and neck on 05/19/2023, which demonstrates vascular calcifications of the descending thoracic aorta,  right carotid bulb, as well as the cavernous and ophthalmic segments of the bilateral internal carotid arteries and right V4 segment. With regards to presenting symptoms, I discussed that findings are unrelated to presenting symptoms. Given radiographic imaging, I discussed that there is currently no indication for neurosurgical intervention, and \\advised  the patient to continue to follow up with PCP and Cardiologist for optimization of medical management. I also advised the patient to return for follow up with PCP for consideration of referral to Ophthalmology for further evaluation of etiology of presenting symptoms. In the interim, in the event the patient develops any new or worsening neurological symptoms, I advised him to inform my office prompting sooner evaluation and imaging. The patient has expressed an understanding and agreement of the plan. All questions were answered.      Plan:   No current indication for neurosurgical intervention   Continue to follow up with PCP and Cardiologist for optimization of medical management  Follow up with PCP for referral to Ophthalmology for further evaluation of etiology of presenting symptoms      Follow up: prn    45 minutes were spent personally by me today on this encounter which include today?s pre-visit review of the chart, obtaining appropriate history, performing an evaluation, documentation, discussion of management with details supported within the note for today?s visit, and today's time spent after the visit documenting and coordinating care. The time documented was exclusive of any time spent on the separately billed procedure.    Shary Decamp MD, [facility]  Associate Professor  Department of Neurosurgery  [facility]  [employer] [facility]  Cerebrovascular and Endovascular Program    SCRIBE SIGNATURE(S):   I, Mannie Wineland Margretta Ditty, have assisted Shary Decamp., MD with the documentation for Kyle David on 06/16/2023 at 9:13 AM.    PHYSICIAN SIGNATURE(S):  Shary Decamp., MD  06/16/2023 9:13 AM    I have reviewed this note, written by Jerolyn Shin and attest that it is an accurate representation of my H & P and other events of the outpatient visit except if otherwise noted.

## 2023-06-17 ENCOUNTER — Other Ambulatory Visit: Payer: BLUE CROSS/BLUE SHIELD

## 2023-06-18 ENCOUNTER — Ambulatory Visit: Payer: BLUE CROSS/BLUE SHIELD

## 2023-06-18 DIAGNOSIS — M542 Cervicalgia: Secondary | ICD-10-CM

## 2023-06-18 DIAGNOSIS — G8929 Other chronic pain: Secondary | ICD-10-CM

## 2023-06-18 NOTE — Progress Notes
 Patient: Kyle David  Date of Birth: 1960/12/14  [place] MRN: [id number]  PCP: Jeneen Mire, MD     SUBJECTIVE:   48M seen for preventive visit  Also discussed the following:    Neurosurgery referral  Seen recently for sudden vision loss with abnormal findings on CT  Referred to neurosurgery but no interventions recommended  No vision changes  No new neurological symptoms    2.  Chronic neck pain  Recurrent episodes  Worse with prolonged sitting/driving  Worse with turning head left and right  No numbness/weakness/tingling  Doesn't interfere with daily activities  Open to PT referral    3. T2DM  A1c 6.8 (04/2023) - obtained at outside facility  Gets eyes checked regularly    Diet:  - b: yogurt, almonds, banana  - l/d: salad, protein  - limited snacking  - 4 servings of diet soda per week    Exercise:  - gym 3 times a week  - treadmill x 30 minutes  - weightlifting    Sleep: no concerns     BH: no concerns for depression/anxiety    Patient Active Problem List   Diagnosis    High blood pressure    Hyperlipemia    History of myocardial infarction    Diabetes mellitus (HCC/RAF)    CAD (coronary artery disease), native coronary artery     Past Medical History:   Diagnosis Date    Concussion     age 86 yrs, skill fx     Hyperlipidemia     Hypertension     Myocardial infarction (HCC/RAF) 2009     No past surgical history on file.  Outpatient Medications Prior to Visit   Medication Sig Dispense Refill    amlodipine 10 mg tablet Take 1 tablet (10 mg total) by mouth daily.      aspirin 81 mg EC tablet Take 1 tablet (81 mg total) by mouth daily.      atorvastatin 40 mg tablet Take 2 tablets (80 mg total) by mouth daily.      metoprolol succinate 25 mg 24 hr tablet take 1/2 tablet by mouth twice a day      lisinopril 10 mg tablet Take by mouth. (Patient not taking: Reported on 06/18/2023)      metFORMIN  500 mg tablet metformin  500 mg tablet (Patient not taking: Reported on 06/18/2023)       No facility-administered medications prior to visit.     No Known Allergies  Family History   Adopted: Yes   Problem Relation Age of Onset    Anesthesia problems Neg Hx     Malignant hypertension Neg Hx     Hypotension Neg Hx     Malignant hyperthermia Neg Hx      Social History     Socioeconomic History    Marital status: Married   Tobacco Use    Smoking status: Never     Passive exposure: Never    Smokeless tobacco: Never   Substance and Sexual Activity    Alcohol use: No     Alcohol/week: 0.0 oz    Drug use: No    Sexual activity: Never   Other Topics Concern    Do you exercise at least a day, 3 or more days a week? Yes    Types of Exercise? (List in Comments) Yes    Do you follow a special diet? Yes    Vegan? No    Vegetarian? No    Pescatarian? No  Lactose Free? No    Gluten Free? No    Omnivore? No       OBJECTIVE:   Vitals Signs  BP 123/72  ~ Pulse 54  ~ Temp 36.2 ?C (97.1 ?F) (Tympanic)  ~ Ht 6' 1'' (1.854 m)  ~ Wt 189 lb (85.7 kg)  ~ SpO2 98%  ~ BMI 24.94 kg/m?  Body mass index is 24.94 kg/m?Aaron Aas    Physical Exam  GEN: alert, oriented, no acute distress  HEENT: normocephalic, atraumatic, EOMI, no conjunctival pallor, TMs intact without bulging or erythema, moist mucus membranes, no pharyngeal erythema, no tonsillar exudates, neck supple, no cervical lymphadenopathy  CV: regular rate, no murmurs/gallops/rubs appreciated, 2+ peripheral pulses, capillary refill < 2 seconds  RESP: normal respiratory effort, no wheezes/rales/rhonchi appreciated  GI: normoactive bowel sounds, soft, non-tender, no guarding, no rigidity  MSK: no gross deformities, normal gait  NEURO: CN II-XII grossly intact  SKIN: no rashes or lesions noted  PSYCH: normal affect, appropriate responses, logical, linear, good insight    ASSESSMENT & PLAN:   1. Encounter for preventive care (Primary)  Will consider vaccines (patient info given)  Will follow up with colonoscopy referral  Labs up-to-date    2. Chronic neck pain  Without radiculopathy  - Referral to [facility], Physical Therapy    3. Type 2 diabetes mellitus without complication, without long-term current use of insulin (HCC/RAF)  A1c 6.8  Reviewed diet and exercise  Repeat 6 months    4. Sudden visual loss of right eye  Resolved  Seen by neurosurgery and no interventions recommended  Return precautions

## 2023-06-18 NOTE — Patient Instructions
 Varicella Zoster Virus Glycoprotein E Injection  Brands: [employer]  Uses  For preventing viral infection.  Instructions  This medicine will be given to you at the doctor's office.  This medicine is given as an injection into a muscle.  This medicine should be given by a trained health care provider.  Tell your doctor and pharmacist about all your medicines. Include prescription and over-the-counter medicines, vitamins, and herbal medicines.  Cautions  Tell your doctor and pharmacist if you ever had an allergic reaction to a medicine.  Tell the doctor or pharmacist if you are pregnant, planning to be pregnant, or breastfeeding.  Side Effects  The following is a list of some common side effects from this medicine. Please speak with your doctor about what you should do if you experience these or other side effects.  lack of energy and tiredness  fever  headaches  pain, redness, swelling near injection  muscle pain  Call your doctor or get medical help right away if you notice any of these more serious side effects:  dizziness  ear problems (ringing in the ears, hearing loss)  fainting  blurring or changes of vision  A few people may have an allergic reaction to this medicine. Symptoms can include difficulty breathing, skin rash, itching, swelling, or severe dizziness. If you notice any of these symptoms, seek medical help quickly.  Extra  Please speak with your doctor, nurse, or pharmacist if you have any questions about this medicine.  [email or web address]  IMPORTANT NOTE: This document tells you briefly how to take your medicine, but it does not tell you all there is to know about it. Your doctor or pharmacist may give you other documents about your medicine. Please talk to them if you have any questions. Always follow their advice. There is a more complete description of this medicine available in [place]. Scan this code on your smartphone or tablet or use the web address below. You can also ask your pharmacist for a printout. If you have any questions, please ask your pharmacist. The display and use of this drug information is subject to Terms of Use. Copyright(c) 2023 [employer]   ? 2000-2023 [employer] [place]. All rights reserved. This information is not intended as a substitute for professional medical care. Always follow your healthcare professional's instructions.

## 2023-06-26 DIAGNOSIS — H53131 Sudden visual loss, right eye: Secondary | ICD-10-CM

## 2023-06-26 DIAGNOSIS — Z0000 Encounter for general adult medical examination without abnormal findings: Secondary | ICD-10-CM

## 2023-06-26 DIAGNOSIS — E119 Type 2 diabetes mellitus without complications: Secondary | ICD-10-CM

## 2023-07-22 ENCOUNTER — Ambulatory Visit: Payer: BLUE CROSS/BLUE SHIELD

## 2023-07-22 ENCOUNTER — Inpatient Hospital Stay
Admit: 2023-07-22 | Discharge: 2023-07-25 | Disposition: A | Discharge status: Home / Self Care | Payer: BLUE CROSS/BLUE SHIELD | Source: Home / Self Care

## 2023-07-22 DIAGNOSIS — Z951 Presence of aortocoronary bypass graft: Secondary | ICD-10-CM

## 2023-07-22 DIAGNOSIS — I4891 Unspecified atrial fibrillation: Secondary | ICD-10-CM

## 2023-07-22 LAB — Prothrombin Time Panel: INR: 0.9 s (ref 9.4–12.5)

## 2023-07-22 LAB — HS Troponin I (Reflexed): HIGH SENSITIVITY TROPONIN I: 9 ng/L (ref 3–<78)

## 2023-07-22 LAB — HS Troponin I + Reflex If >=  5 ng/L: HIGH SENSITIVITY TROPONIN I: 10 ng/L (ref 3–<78)

## 2023-07-22 LAB — CBC: RED CELL DISTRIBUTION WIDTH-SD: 41.7 fL (ref 36.9–48.3)

## 2023-07-22 LAB — APTT: APTT: 26.2 s (ref 25.0–37.0)

## 2023-07-22 LAB — Differential Automated: ABSOLUTE IMMATURE GRAN COUNT: <0.03 10*3/uL (ref 0.00–0.04)

## 2023-07-22 LAB — B-Type Natriuretic Peptide: BNP: 329 pg/mL — ABNORMAL HIGH (ref 0–99)

## 2023-07-22 LAB — CK, Total: CREATINE PHOSPHOKINASE, TOTAL: 101 U/L (ref 39–308)

## 2023-07-22 LAB — Comprehensive Metabolic Panel: ASPARTATE AMINOTRANSFERASE: 18 U/L (ref 15–37)

## 2023-07-22 MED ADMIN — HEPARIN (PORCINE) IN NACL 25000-0.45 UT/250ML-% IV SOLN: 1000 [IU]/h | INTRAVENOUS | Stop: 2023-07-22 | NDC 63323051701

## 2023-07-22 MED ADMIN — HEPARIN SODIUM (PORCINE) 1000 UNIT/ML IJ SOLN: 5000 [IU] | INTRAVENOUS | @ 23:00:00 | Stop: 2023-07-22 | NDC 00409272031

## 2023-07-22 NOTE — Progress Notes
 Pharmaceutical Services - Heparin Consult Management Note    Objective Data   Allergies: Patient has no known allergies.  Height:   Most recent documented height   07/22/23 1.829 m (6')     Actual Weight:   Most recent documented weight   07/22/23 83.5 kg       Heparin drip administrations (last 48 hours)       None            Anticoagulant/Antiplatelet administrations (last 48 hours)       None            Current Labs  Recent Labs     07/22/23  1131   APTT 26.2     No results for input(s): ''HEPANTIXAUFH'' in the last 48 hours.  Recent Labs     07/22/23  1131   WBC 8.18   HGB 16.2   HCT 47.1   MCV 92.9   PLT 274       INR   Date Value Ref Range Status   07/22/2023 0.9 . Final     Comment:     Therapeutic Range: INR 2-3  Mechanical Valves: INR 2.5-3.5       Bilirubin,Total   Date Value Ref Range Status   07/22/2023 1.1 (H) 0.2 - 1.0 mg/dL Final       Assessment:  Protocol: Regular Intensity: Anti-Xa goal 0.3-0.7 or aPTT goal 66-108  Anti-Xa aPTT Re-bolus if Ordered  (NTE 5,000 units) /  Hold Infusion Rate Change Check Anti-Xa/aPTT   <0.1 <46 50 units/kg Increase by 4 units/kg/hr 6 hours after rate change   0.1-0.19 46-55.9 25 units/kg Increase by 3 units/kg/hr    0.2-0.29 56-65.9 - Increase by 2 units/kg/hr    0.3-0.7 66-108 Continue current rate 6 hours;  If therapeutic x2, recheck qAM   0.71-0.8 108.1-118 - Decrease by 1 unit/kg/hr 6 hours after rate change   0.81-0.9 118.1-128 - Decrease by 2 units/kg/hr    0.91-1 128.1-140 Hold for 1 hour Decrease by 3 units/kg/hr    >1 >140 Hold infusion and repeat STAT anti-Xa/aPTT in 1 hour.  If anti-Xa <= 1 or aPTT <= 140, restart infusion and decrease by 4 units/kg/hr. Repeat anti-Xa/aPTT 6 hours after rate change.  If anti-Xa > 1 or aPTT > 140, continue to hold and repeat anti-Xa/aPTT in 1 hour. Notify provider.       Indication:   Goal: aPTT 66-108 (per ER MD Dr Enrigue Harvard. Admitting MD Dr Hortensia Ma for potential change in intensity to target app 66-87)   Dosing weight: 83.5 kg  Concurrent warfarin: No  Concomitant medications that may contribute to bleeding: aspirin  Pending invasive procedures: nothing scheduled   Signs and symptoms of bleeding/bruising within 24 hours: not documented  List of potential drug interactions: atorvastatin, amlodipine     Plan:  Kyle David is a 63 y.o. male who has been referred to pharmacy for therapeutic heparin management.     Bolus: Give 5000 units once  Infusion: Initiate at 1000 units/hr    Next anti-Xa/aPTT ordered at  07/22/23 @ 2200 . Plan was communicated to RN.  Pharmacy will continue to monitor the patient?s clinical progress and communicate any changes with treatment team.  For questions about this patient's heparin therapy, please call [phone] [facility]) or [phone] [facility]) or *[phone] [facility]).    Vivian Groom, PharmD, 07/22/2023, 3:44 PM

## 2023-07-22 NOTE — ED Notes
 Report from [facility] for 45 minute break coverage. Pt a/o x4, speaking in clear and complete sentences. Bilateral chest rise/fall, respirations even/unlabored. Skin warm/dry, CSM intact. Pain currently 0/10. NAD noted at this time. Safety measures remain in place, bed low and locked, side rails x2, call light within reach. Denies further needs at this time. Will continue to monitor.

## 2023-07-22 NOTE — H&P
 HOSPITAL MEDICINE HISTORY AND PHYSICAL NOTE - TH    PATIENT: Kyle David  MRN: [id number]  DOB: 06-06-1960  DATE OF SERVICE: 07/22/2023      PRIMARY CARE PROVIDER: Demirchyan, Daniel, MD    REFERRING PHYSICIAN:   No referring provider defined for this encounter.    DATE OF SERVICE: 07/22/2023    PRESENTING COMPLAINTS:new atrial fibrillation    HISTORY OF PRESENT ILLNESS:  Kyle David is a 63 y.o. male with a history of hypertension, hyperlipidemia, diabetes (A1c Jan 2025 6.5%), coronary artery disease with myocardial infraction 2009, the 4 vessel coronary artery by pass April 2023, presenting with acute shortness of breath, palpitation and dizziness, found to have new rate controlled atrial fibrillation with CHAD2Vasc score of atleast 3, labs with TB 1.1, ALP 120, BNP 329    Kyle David was in his usual state of health until earlier today when he began experiencing palpitations, chest discomfort similar character to his prior heart attack though less intensity, not exertional, non pleuritic, non tender, and not positional. He also reported associated dizziness, and shortness of breath.  He has no orthopnea, paroxysmal nocturnal dyspnea, leg swelling nor weight gain.    He has no associated cough, presyncope, syncope, abdominal pain, vomiting, diarrhea, bloody stools nor melena, dysuria, fever, weight loss, joint stiffness, skin changes, headaches, focal limb weakness, new visual problems, trouble swallowing.       On presentation to the ED  Vital Signs 112/70 68BPM 96% on RA 36.8  ECG Atrial fibrillation with HR 58  Labs BNP 129, ALP 120, TB 1.1  Microbiology NA  Radiology CXR with no acute process  Intervention     PAST MEDICAL HISTORY:  Past Medical History:   Diagnosis Date    Concussion     age 59 yrs, skill fx     Hyperlipidemia     Hypertension     Myocardial infarction (HCC/RAF) 2009       PAST SURGICAL HISTORY:  Past Surgical History:   Procedure Laterality Date    CARDIAC SURGERY  06/2021       OUTPATIENT MEDICATIONS:  No outpatient medications have been marked as taking for the 07/22/23 encounter [facility] Encounter).         INPATIENT MEDICATIONS:   amLODIPine  10 mg Oral Daily    aspirin  81 mg Oral Daily    atorvastatin  80 mg Oral Daily    enoxaparin  1 mg/kg Subcutaneous BID    metoprolol succinate  12.5 mg Oral BID    pantoprazole  40 mg IV Push Q24H           ALLERGIES:  No Known Allergies    SOCIAL HISTORY:  Social History     Socioeconomic History    Marital status: Married   Tobacco Use    Smoking status: Never     Passive exposure: Never    Smokeless tobacco: Never   Substance and Sexual Activity    Alcohol use: No     Alcohol/week: 0.0 oz    Drug use: No    Sexual activity: Never   Other Topics Concern    Do you exercise at least a day, 3 or more days a week? Yes    Types of Exercise? (List in Comments) Yes    Do you follow a special diet? Yes    Vegan? No    Vegetarian? No    Pescatarian? No    Lactose Free? No    Gluten Free? No  Omnivore? No       FAMILY HISTORY:  Family History   Adopted: Yes   Problem Relation Age of Onset    Anesthesia problems Neg Hx     Malignant hypertension Neg Hx     Hypotension Neg Hx     Malignant hyperthermia Neg Hx        REVIEW OF SYSTEMS:  A 14 point review of systems was conducted and as per HPI and Interval Events otherwise negative.     INTAKE AND OUTPUT:  UOP:   I/Os: No intake or output data in the 24 hours ending 07/22/23 1659    PHYSICAL EXAMINATION:  VITALS: BP 112/70 (Patient Position: Lying)  ~ Pulse 68  ~ Temp 36.8 ?C (98.2 ?F) (Oral)  ~ Resp 16  ~ Ht 1.829 m (6')  ~ Wt 83.5 kg (184 lb)  ~ SpO2 96%  ~ BMI 24.95 kg/m?    General: well developed well nourished male in no acute distress  HEENT: extraocular movements intact, anicteric, oropharynx clear, moist mucous membranes  Neck: no jugular venous distention. No lymphadenopathy  CV: irregular rate and rhythm, normal S1, S2. no rubs, murmurs, or gallops  Pulm: clear to auscultation bilaterally, no retractions  GI: soft, non tender, non distended, normoactive bowel sounds, no hepatosplenomegaly  Ext: no clubbing, cyanosis, or edema  Vascular: 2+ pulses throughout  Skin: warm, dry , and well perfused  Neuro: grossly non focal  Psych: mood and affect appropriate  MS: strength intact        LABORATORY:  Reviewed in electronic medical record. Notable for:    CBC  Recent Labs     07/22/23  1131   WBC 8.18   HGB 16.2   HCT 47.1   MCV 92.9   PLT 274     BMP  Recent Labs     07/22/23  1131   NA 137   K 4.3   CL 107   CO2 30   BUN 12   CREAT 1.00   CALCIUM 9.4     LFT  Recent Labs     07/22/23  1131   TOTPRO 6.7   ALBUMIN 3.7   BILITOT 1.1*   ALT 37   AST 18   ALKPHOS 120*     Coags  Recent Labs     07/22/23  1131   INR 0.9   PT 10.3   APTT 26.2           DIAGNOSTIC STUDIES:  Imaging as noted      ASSESSMENT:  Kyle David is a 63 y.o. male with with a history of hypertension, hyperlipidemia, diabetes (A1c Jan 2025 6.5%), coronary artery disease with myocardial infarction 2009, the 4 vessel coronary artery by pass April 2023, presenting with acute shortness of breath, palpitation and dizziness, found to have new rate controlled atrial fibrillation with CHAD2Vasc score of atldeast 3, labs with Tb 1.1, ALP 120, BNP 329      Patient Active Problem List   Diagnosis    High blood pressure    Hyperlipemia    History of myocardial infarction    Diabetes mellitus (HCC/RAF)    CAD (coronary artery disease), native coronary artery         DISCUSSION/PLAN:  Symptomatic rate controlled new atrial fibrillation. Presenting with acute shortness of breath, palpitation and dizziness, found to have new rate controlled atrial fibrillation. History with substrate, no clear acute triggers,  with CHAD2Vasc score of atldeast 3, labs with Tb  1.1, ALP 120, BNP 329  Telemetry  TSH  Cardiology CS, antiarrythmic strategy  Start anticoagulation with lovenox, he is okay with this, will switch to NOAC on discharge  Continue metoprolol 12.5 BID    Chest discomfort. He noted this to be of similar charctar to his heart attck however non exertional, and lesser intensity. ECG with no ischemic changes. hsTnT 10 and 9.  ECG and troponin with chest pain.    Diabetes. Most recent A1c 6.5%  A1C  ISS    Coronary artery disease. Myocardial infarction 2009, the 4 vessel coronary artery by pass April 2023  Continue aspirin 81mg , atorvastatin 40mg     Hypertension  Continue amlodipine 10mg     Mild bilirubin elevation iTB 1,1. ALP also mildly elevated. No abdominal symptom  RUQUS pending      Prophylaxis: pantoprazole, he is on therapeutic lovenox      Author: Tamaiya Bump O. Wilkie Harding, MD, MPH 07/22/2023 4:59 PM

## 2023-07-22 NOTE — ED Notes
 Initial ED RN Assessment  Chief Complaint   Patient presents with    Chest Pain     Pt sent here by cardiologist for abnormal EKG. Pt c/o chest pain and sob.      HPI:  Introduced self to patient. Arrived via [place] from home complaining of chest pressure and that his apple watch recorded an abnormal heart rhythm at home. Was seen by his cardiologist who told him that he is in a-fib and should go to the ER.      Primary Assessment:  - Alert and oriented x4  - Resp e/u  - calm and cooperative with care    PMH as follows:  Past Medical History:   Diagnosis Date    Concussion     age 63 yrs, skill fx     Hyperlipidemia     Hypertension     Myocardial infarction (HCC/RAF) 2009     - All questions and concerns were addressed.  - Fall precautions maintained.  - Call light within reach.

## 2023-07-22 NOTE — Nursing Note
 Report given to [facility]. Patient to be transferred to room 661-2

## 2023-07-22 NOTE — ED Provider Notes
 [facility]  Emergency Department Service Report    Kyle David 63 y.o. male , presents with Chest Pain    Triage   Arrived on 07/22/2023 at 10:34 AM   Arrived by Walk-in [14]    ED Triage Vitals   Temp Temp Source BP Heart Rate Resp SpO2 O2 Device Pain Score Weight   07/22/23 1000 07/22/23 1000 07/22/23 1000 07/22/23 1000 07/22/23 1000 07/22/23 1000 07/22/23 1000 07/22/23 1134 07/22/23 1000   36.8 ?C (98.2 ?F) Oral 131/86 67 18 100 % None (Room air) Four 83.5 kg (184 lb)       Pre hospital care:       No Known Allergies      Initial Physician Contact     Medical Screening Exam Initiated       Date/Time Event User Comments    07/22/23 1054 MSE Initiated Tara Fanti --          Medical Screening Exam - MD Comments      Date and Time MSE MD Comment User   07/22/23 1054 pt here for CP, SOB sent by cardiologist. ekg was abnormal there. has hx of bypass surgery. ekg today shows afib, last EKG done was sinus brady AD            Initial Contact Completed?: Yes (07/22/23 1228)      History   The patient is a 63 year old male with history of coronary artery disease status post quadruple bypass, aortic valve replacment, dyslipidemia, hypertension, presenting to the emergency department at the request of his cardiologist for assessment and admission.  The patient, who wears a smart watch, states that yesterday morning he began experiencing palpitations and his watch alerted him that he was in atrial fibrillation.  He spoke with his Cardiologist, Dr Delano Feast, and was seen at her office.  An EKG demonstrated atrial fibrillation with regular rate.  A prior EKG dated 06/23/2023 had shown sinus bradycardia.  The patient reports that he has vague and very mild pressure-like sensation in his chest and some shortness of breath.  No lightheadedness, no syncope.  He still does feel palpitations.  Denies any drug or alcohol use.  He reports compliance with all of his medications.    The history is provided by the patient.          Language Assistance                         Past Medical History:   Diagnosis Date    Concussion     age 23 yrs, skill fx     Hyperlipidemia     Hypertension     Myocardial infarction (HCC/RAF) 2009        Past Surgical History:   Procedure Laterality Date    CARDIAC SURGERY  06/2021        Past Family History   Family history reviewed by me and there is no pertinent past family history related to the patient's current case and/or care.             Past Social History   he reports that he has never smoked. He has never been exposed to tobacco smoke. He has never used smokeless tobacco. He reports that he does not drink alcohol, does not use drugs, and does not engage in sexual activity.       Physical Exam   Physical Exam  Vitals and nursing note reviewed.  Constitutional:       Appearance: Normal appearance. He is not toxic-appearing.      Comments: In no acute distress   HENT:      Head: Normocephalic.      Mouth/Throat:      Mouth: Mucous membranes are moist.   Eyes:      Extraocular Movements: Extraocular movements intact.      Conjunctiva/sclera: Conjunctivae normal.      Pupils: Pupils are equal, round, and reactive to light.   Cardiovascular:      Rate and Rhythm: Normal rate. Rhythm irregular.      Pulses: Normal pulses.      Heart sounds: No murmur heard.     No gallop.   Pulmonary:      Effort: No respiratory distress.      Breath sounds: Normal breath sounds.   Abdominal:      General: Bowel sounds are normal.      Palpations: Abdomen is soft.      Tenderness: There is no abdominal tenderness. There is no guarding.   Musculoskeletal:         General: No swelling, tenderness or deformity. Normal range of motion.      Cervical back: Normal range of motion and neck supple.   Lymphadenopathy:      Cervical: No cervical adenopathy.   Skin:     General: Skin is warm and dry.      Findings: No rash.   Neurological:      General: No focal deficit present.      Mental Status: He is alert and oriented to person, place, and time.   Psychiatric:         Mood and Affect: Mood normal.         Behavior: Behavior normal.         ED Course     ED Course as of 07/22/23 1425   Wed Jul 22, 2023   1305 D/w Dr Wilkie Harding, will see and admit patient [DK]   1349 Spoke w Dr Becki Bouton who asked that I start heparin [DK]      ED Course User Index  [DK] Kelley Paterson, MD       Laboratory Results     Labs Reviewed   BNP - Abnormal; Notable for the following components:       Result Value    BNP 329 (*)     All other components within normal limits   COMPREHENSIVE METABOLIC PANEL - Abnormal; Notable for the following components:    Anion Gap 0 (*)     Glucose 189 (*)     Bilirubin,Total 1.1 (*)     Alkaline Phosphatase 120 (*)     All other components within normal limits   DIFFERENTIAL, AUTOMATED (PERFORMABLE) - Abnormal; Notable for the following components:    Absolute Lymphocyte Count 1.11 (*)     All other components within normal limits   PROTHROMBIN TIME PANEL - Normal   APTT - Normal   CK, TOTAL - Normal   HS TROPONIN I + REFLEX IF >= 5 NG/L - Normal   HS TROPONIN I (REFLEXED) - Normal   CBC & AUTO DIFFERENTIAL    Narrative:     The following orders were created for panel order CBC & Plt & Diff.  Procedure  Abnormality         Status                     ---------                               -----------         ------                     MWU[[id number]]                                              Final result               Differential, Automated[[id number]]      Abnormal            Final result                 Please view results for these tests on the individual orders.   CBC (PERFORMABLE)       Imaging Results     XR chest ap portable (1 view)   Final Result by Sweigert, Joshua K., MD (05/07 1147)   IMPRESSION:     Status post median sternotomy with intact sternotomy wires.      Normal heart size. Mildly tortuous thoracic aorta with atherosclerotic calcifications. Status post aortic valve replacement. Lead is noted projecting over the midline cardiac silhouette from below.   Normal lung volumes. No consolidation.   No pleural effusions. No pneumothorax.   No acute osseous abnormalities.         Signed by: Delma Fern   07/22/2023 11:47 AM          Administered Medications     Medication Administration from 07/22/2023 1035 to 07/22/2023 1425       None                       Procedures     ED ECG interpretation    Date/Time: 07/22/2023 1:40 PM    Performed by: Kelley Paterson, MD  Authorized by: Kelley Paterson, MD    Interpretation:     Interpretation: abnormal    Rate:     ECG rate:  58  Rhythm:     Rhythm: atrial fibrillation    Ectopy:     Ectopy: none    QRS:     QRS axis:  Normal    QRS conduction: normal    ST segments:     ST segments:  Normal  T waves:     T waves: normal         Medical Decision Making   Kyle David is a 63 y.o. male    Medical Decision Making  The patient is a 63 year old male with history of coronary artery disease status post quadruple bypass and aortic valve replacement, hypertension, dyslipidemia, who referred to the emergency department for new onset atrial fibrillation.  The patient is comfortable and hemodynamically stable, rate controlled, but still has mild chest discomfort.  I spoke with his cardiologist, Dr Becki Bouton, who recommended we initiate a heparin drip.  He has been seen by internal medicine and will be admitted to a monitored setting for additional care and workup.      Amount and/or Complexity of  Data Reviewed  External Data Reviewed: ECG.     Details: EKGs from earlier today as well as 06/23/2023 reviewed.    Risk  Decision regarding hospitalization.          Launch MDCalc MDM Tool   Click the link above to launch the MDCalc MDM tool. Save the tool results and then refresh the note (lower-left corner or Ctrl+[id number]). The MDM tool results must be imported into the note to sign it.      Clinical Impression           Atrial fibrillation, unspecified type (HCC/RAF) (Primary)  Hx of CABG      Prescriptions     New Prescriptions    No medications on file       Disposition and Follow-up   Disposition:  Admit [3]     Future Appointments   Date Time Provider Department Center   12/18/2023  8:00 AM Demirchyan, Bearl Limes, MD CPN [facility]       Follow up with:  No follow-up provider specified.    Return precautions are specified on After Visit Summary.                   Kelley Paterson, MD  07/22/23 1426

## 2023-07-22 NOTE — ED Notes
 Incorrect type of heparin was stocked in med-py[facility]. Called pharmacy and they state that they will tube the correct heparin to the ER.

## 2023-07-22 NOTE — ED Notes
 Per Dr Delpha Fickle, discontinue heparin infusion at this time.

## 2023-07-23 ENCOUNTER — Ambulatory Visit: Payer: BLUE CROSS/BLUE SHIELD

## 2023-07-23 LAB — Hgb A1c: HGB A1C: 5.9 % HbA1c (ref 4.2–6.3)

## 2023-07-23 LAB — B-Type Natriuretic Peptide: BNP: 196 pg/mL — ABNORMAL HIGH (ref 0–99)

## 2023-07-23 LAB — Glucose,POC
GLUCOSE,POC: 154 mg/dL — ABNORMAL HIGH (ref 65–99)
GLUCOSE,POC: 159 mg/dL — ABNORMAL HIGH (ref 65–99)
GLUCOSE,POC: 165 mg/dL — ABNORMAL HIGH (ref 65–99)
GLUCOSE,POC: 178 mg/dL — ABNORMAL HIGH (ref 65–99)

## 2023-07-23 LAB — TSH with reflex FT4, FT3
TSH: 1.6 u[IU]/mL (ref 0.3–4.7)
TSH: 1.2 u[IU]/mL (ref 0.3–4.7)

## 2023-07-23 LAB — APTT: APTT: 34.9 s (ref 25.0–37.0)

## 2023-07-23 LAB — Differential Automated: MONOCYTE PERCENT, AUTO: 10.1 % (ref 0.20–0.80)

## 2023-07-23 LAB — Magnesium: MAGNESIUM: 2 meq/L (ref 1.5–2.0)

## 2023-07-23 LAB — Comprehensive Metabolic Panel: ALANINE AMINOTRANSFERASE: 37 U/L (ref 16–61)

## 2023-07-23 LAB — HS Troponin I (Diluted): HIGH SENSITIVITY TROPONIN I: 10 ng/L (ref 3–<78)

## 2023-07-23 LAB — CBC: ABSOLUTE NUCLEATED RBC COUNT: 0 10*3/uL (ref 0.00–0.00)

## 2023-07-23 LAB — Lipid Panel: NON-HDL,CHOLESTEROL,CALC: 75 mg/dL (ref 20–<130)

## 2023-07-23 MED ADMIN — PANTOPRAZOLE SODIUM 40 MG IV SOLR: 40 mg | INTRAVENOUS | @ 02:00:00 | Stop: 2023-07-23 | NDC 71288060010

## 2023-07-23 MED ADMIN — MAGNESIUM SULFATE 2 GM/50ML IV SOLN: 2 g | INTRAVENOUS | @ 17:00:00 | Stop: 2023-07-23 | NDC 00338170840

## 2023-07-23 MED ADMIN — ENOXAPARIN SODIUM 100 MG/ML IJ SOSY: 85 mg | SUBCUTANEOUS | @ 05:00:00 | Stop: 2023-07-23 | NDC 71288043688

## 2023-07-23 MED ADMIN — ATORVASTATIN CALCIUM 80 MG PO TABS: 80 mg | ORAL | @ 15:00:00 | Stop: 2023-08-21 | NDC 68084059025

## 2023-07-23 MED ADMIN — METOPROLOL SUCCINATE ER 25 MG PO TB24: 12.5 mg | ORAL | @ 15:00:00 | Stop: 2023-07-26 | NDC 00904632261

## 2023-07-23 MED ADMIN — INSULIN ASPART 100 UNIT/ML IJ SOLN: 10 mL | SUBCUTANEOUS | @ 20:00:00 | Stop: 2023-07-26 | NDC 00169750111

## 2023-07-23 MED ADMIN — ATORVASTATIN CALCIUM 80 MG PO TABS: 80 mg | ORAL | @ 02:00:00 | Stop: 2023-07-26 | NDC 00904629304

## 2023-07-23 MED ADMIN — ASPIRIN 81 MG PO TBEC: 81 mg | ORAL | @ 15:00:00 | Stop: 2023-07-26 | NDC 00536123441

## 2023-07-23 MED ADMIN — AMLODIPINE BESYLATE 10 MG PO TABS: 10 mg | ORAL | @ 02:00:00 | Stop: 2023-07-26 | NDC 60687049611

## 2023-07-23 MED ADMIN — PANTOPRAZOLE SODIUM 40 MG PO TBEC: 40 mg | ORAL | @ 23:00:00 | Stop: 2023-07-26 | NDC 60687073609

## 2023-07-23 MED ADMIN — METOPROLOL SUCCINATE ER 25 MG PO TB24: 12.5 mg | ORAL | @ 05:00:00 | Stop: 2023-08-22 | NDC 00904632261

## 2023-07-23 MED ADMIN — ENOXAPARIN SODIUM 100 MG/ML IJ SOSY: 85 mg | SUBCUTANEOUS | @ 15:00:00 | Stop: 2023-07-23 | NDC 71288043688

## 2023-07-23 MED ADMIN — AMLODIPINE BESYLATE 10 MG PO TABS: 10 mg | ORAL | @ 15:00:00 | Stop: 2023-07-26 | NDC 60687049611

## 2023-07-23 MED ADMIN — ASPIRIN 81 MG PO TBEC: 81 mg | ORAL | @ 02:00:00 | Stop: 2023-07-26

## 2023-07-23 NOTE — Consults
 Chief Complaint/Reason for Consult:   Chief Complaint   Patient presents with    Chest Pain     Pt sent here by cardiologist for abnormal EKG. Pt c/o chest pain and sob.      History of Present Illness & Brief Hospital Course:  [name] is a(n) 63 y.o. male with h/o CAD, CABG, s/p AVR, HTN, DM  - came to ER with palpitations and SOB. In ER found in atrial fibrillation with rate controlled    Review of systems: A 14 point review of systems was performed and is noncontributory except as mentioned in history of present illness.    Past Medical History:   Past Medical History:   Diagnosis Date    Concussion     age 68 yrs, skill fx     Hyperlipidemia     Hypertension     Myocardial infarction (HCC/RAF) 2009     Past Surgical History:   Past Surgical History:   Procedure Laterality Date    CARDIAC SURGERY  06/2021     Allergies: No Known Allergies  Current Medications: Scheduled Meds:   amLODIPine  10 mg Oral Daily    aspirin  81 mg Oral Daily    atorvastatin  80 mg Oral Daily    enoxaparin  1 mg/kg Subcutaneous BID    metoprolol succinate  12.5 mg Oral BID    pantoprazole  40 mg IV Push Q24H     Continuous Infusions:  PRN Meds:acetaminophen **OR** acetaminophen suppository, bisacodyl, insulin aspart **AND** dextrose, lidocaine, magnesium hydroxide, senna, temazepam  Family History:   Family History   Adopted: Yes   Problem Relation Age of Onset    Anesthesia problems Neg Hx     Malignant hypertension Neg Hx     Hypotension Neg Hx     Malignant hyperthermia Neg Hx      Social History:   Social History     Tobacco Use    Smoking status: Never     Passive exposure: Never    Smokeless tobacco: Never   Substance Use Topics    Alcohol use: No     Alcohol/week: 0.0 oz    Drug use: No       Physical Examination:  BP 136/77  ~ Pulse 74  ~ Temp 36.6 ?C (97.9 ?F) (Oral)  ~ Resp 18  ~ Ht 1.829 m (6' 0.01'')  ~ Wt 84 kg (185 lb 3 oz)  ~ SpO2 96%  ~ BMI 25.11 kg/m?   General: Alert and oriented. Calm and cooperative. Comfortable. Not in painful or respiratory distress.  HEENT: Normocephalic. Sclera anicteric. Pupils are equal. Oropharynx is clear. Jugular venous pulses not  elevated.  Cardiovascular: Rate is normal. Rhythm is regular. Lungs: Breathing is unlabored. Breath sounds are clear to auscultation.  Abdomen: Soft and non-tender. No distension. No organomegaly appreciated. Bowel sounds present.  Neuro: Alert and oriented x 3. No obvious gross focal deficit. No tremors.  Extremities: Warm to touch. No edema or cyanosis.  Distal pulses: 2+ bilateral radial and dorsalis pedis pulses.    Assessment:  New diagnosed atrial fibrillation  Arrhythmia and tachycardia   CAD/s/p CABG  Ischemic cardiomyopathy   S/p AVR  HTN  DM   Heart failure with preserved EF, acute    Discussion & Recommendations:  Discussed with patient in detail option of treatment , rate versus rhythm control   Plan to continue same medication  Start Eliquis   Check TSH   Replace electrolytes  Walk today and if still c/o SOB/DOE  -consider TEE with cardioversion in AM. IF feels well, can discharge today home and consider the electrical cardioversion if needed in a couple of weeks of AC    Claudius Mich. MD

## 2023-07-23 NOTE — Consults
 Occupational Therapy Evaluation      PATIENT: Kyle David  MRN: [id number]  DOB: [birth year]    ADMIT DATE: 07/22/2023       Date of Evaluation: 07/23/2023    Problems: Active Problems:    * No active hospital problems. *       Past Medical History:   Diagnosis Date    Concussion     age 63 yrs, skill fx     Hyperlipidemia     Hypertension     Myocardial infarction (HCC/RAF) 2009    Past Surgical History:   Procedure Laterality Date    CARDIAC SURGERY  06/2021        Relevant Hospital Course: Kyle David is a 63 y.o. male with a history of hypertension, hyperlipidemia, diabetes (A1c Jan 2025 6.5%), coronary artery disease with myocardial infraction 2009, the 4 vessel coronary artery by pass April 2023, presenting with acute shortness of breath, palpitation and dizziness, found to have new rate controlled atrial fibrillation      Patient Stated Goal:  To return to PLOF     Living Arrangements   Type of Home: House  Home Layout: One level  Bathroom Shower/Tub: Cabin crew: None  Home Equipment: None    Prior Level of Function   Level of Independence: Independent (Pt was active, goes to the gym regularly, and drives.)  Lives With: Spouse  Support Available: Family  ADL Assistance: Independent  Vision: Within Functional Limits  Hearing: Within Functional Limits  Transportation: Drives Self    Precautions   Precautions: Fall risk  Orthotic: None  Current Activity Order: Activity as tolerated  Weight Bearing Status: Not Applicable  Additional Weight Bearing Status: Not Applicable    GENERAL EVALUATION   Position: In bed;Family/CG present    Bed Mobility   Supine Scooting: Stand by Assist  Supine to Sit: Stand by Assist  Sit to Supine: Stand by Assist    Functional Transfers   Sit to Stand: Stand by Assist  Transfer: Not Performed  Functional Mobility: Stand by Assist (No AD)    Activities of Daily Living (ADLs)   Eating: Not performed  Grooming: Performed  Grooming Assistance: Independent  Grooming Deficit: Catering manager: None  Grooming Where Assessed: Edge of bed  Bathing: Not performed  UB Dressing: Not performed  LB Dressing: Not performed  Toileting: Not performed    Balance   Sitting - Static: Fair  Sitting - Dynamic: Fair     RUE Assessment   RUE Assessment: Within Functional Limits              LUE Assessment   LUE Assessment: Within Functional Limits              Hand Function   Gross Grasp: Functional  Coordination: Functional      Sensation   Sensation: Grossly intact    Cognition   Cognition: Within Defined Limits  Safety Awareness: Good awareness of safety precautions  Barriers to Learning: None    Vision   Current Vision: No visual deficits       Neurological Evaluation (if indicated)   Neuro Deficits: No    Pain Assessment   Patient complains of pain: No      Patient Status   Activity Tolerance: Poor (low endurance)  Oxygen Needs: Room Air  Response to Treatment: Tolerated treatment well;Fatigued;with activity;Resolved with rest;Vital signs stable (Seated EOB BP: 124/77, Standing (after walking) BP: 123/76)  Call light  in reach: Yes  Presentation post treatment: In bed;Side rails up;Family/CG present    Interdisciplinary Communication   Interdisciplinary Communication: Nurse;Physical Therapist;Physician    ASSESSMENT   Rehab Potential: Good  Inpatient Recommendation: OT treatment  Problem List: Impaired functional endurance;Decreased self care skills;Decreased functional balance  Present During Evaluation: Limitation of activities due to disability (Z73.6)  Treatment Plan: ADL training;Therapeutic exercise;Graded functional activities;Functional balance activities;Home program;Caregiver training;Energy conservation;Functional transfer training  Frequency: 3-5 x/week  Duration (days): 30  Progress Note Due Date: 07/30/23    Goals Discussed With: Patient;Family    Short Term Goals to be achieved in: 7 days  Pt will toilet self: with supervision  Pt will dress upper body: independently  Pt will dress lower body: with supervision  Pt will perform: stand step transfer;to/from toilet;with supervision    Long Term Goals to be achieved in: 30 days  Pt will toilet self: independently  Pt will dress lower body: independently  Pt will perform: to/from toilet;independently    OT Recommendations   Discharge Recommendation: Occupational Therapy;Would benefit from continued therapy  Discharge concerns: Requires supervision for mobility;Requires supervision for self care  Discharge Equipment Recommended: Other (Comment) (grab bars in shower)    AM-PAC   AM-PAC Daily Activity Raw Score: 21  AM-PAC Daily Activity t-Scale Score: 44.27  AM-PAC Daily Activity CMS 0-100% Score: 32.79 %  AM-PAC Daily Activity CMS 'G Code' Modifier: CJ      Evaluation Completed by: Arleta Bench, OT,  07/23/2023

## 2023-07-23 NOTE — Other
 Patient's Clinical Goal:   Clinical Goal(s) for the Shift: stable heart rate and no sob  Identify possible barriers to advancing the care plan: none  Stability of the patient: Moderately Unstable - medium risk of patient condition declining or worsening    Progression of Patient's Clinical Goal: Afib controlled overnight, NPO pending echo.

## 2023-07-23 NOTE — Other
 Patient's Clinical Goal:   Clinical Goal(s) for the Shift: cardiology consult  Identify possible barriers to advancing the care plan: TEE and cardioversion  Stability of the patient: Moderately Unstable - medium risk of patient condition declining or worsening    Progression of Patient's Clinical Goal: Pt is in afib controlled. Pt is alert and ambulatory. Pt has been getting fatigued and short of breath when ambulating in the hallway with OT, so cardioogy plans to perform TEE and cardioversion in the morning, NPO at midnight. Pt agrees to go through with it.

## 2023-07-23 NOTE — Progress Notes
 [facility] - Admission Medication Reconciliation Note      Patient Name: Kyle David  Medical Record Number: [id number]  Admit date: 07/22/2023 4:47 PM    Age: 63 y.o.  Sex: male  Allergies: No Known Allergies  Height:   Most recent documented height   07/22/23 1.829 m (6' 0.01'')     Actual Weight:   Most recent documented weight   07/23/23 84 kg   06/18/23 85.7 kg     Diagnosis: The patient is currently admitted with the following concerns/issues: Active Problems:    * No active hospital problems. *      Reported Medication History   I spoke with the patient at bedside and spoke with family at bedside to update the home medication list for this hospital admission.    PTA Medication List (discrepancies are noted)   Medications Prior to Admission   Medication Sig Last Dose/Taking    amlodipine 10 mg tablet Take 1 tablet (10 mg total) by mouth daily.     aspirin 81 mg EC tablet Take 1 tablet (81 mg total) by mouth daily.     atorvastatin 40 mg tablet Take 1 tablet (40 mg total) by mouth daily.     B Complex Vitamins (VITAMIN B COMPLEX) capsule Take 1 capsule by mouth daily.     Cholecalciferol (VITAMIN D) 50 mcg (2000 units) CAPS Take 1 capsule (50 mcg total) by mouth daily.     magnesium oxide 400 mg tablet Take 1 tablet (400 mg total) by mouth daily.     metoprolol succinate 25 mg 24 hr tablet take 1/2 tablet by mouth twice a day        Discharge Prescription Preference:   [id number] - [place], [place] - [address]  [address]  [place] [address]      The patient's allergies and medications have been reviewed and updated.     No longer takes Plavix. Last billed on 03/15/23 (30 day supply).    The reconciliation of admission orders with PTA med list is complete. There are no issues requiring follow up at this time.    Viren Lebeau Kristy Marshun Duva, PharmD, 07/23/2023, 4:15 PM

## 2023-07-23 NOTE — Consults
 CASE MANAGER ASSESSMENT      Admit ZOXW:[id number]    Date of Initial CM Assessment: 07/23/2023    Problems: Active Problems:    * No active hospital problems. *       Past Medical History:   Diagnosis Date    Concussion     age 63 yrs, skill fx     Hyperlipidemia     Hypertension     Myocardial infarction (HCC/RAF) 2009    Past Surgical History:   Procedure Laterality Date    CARDIAC SURGERY  06/2021          Primary Care Physician:Demirchyan, Bearl Limes, MD  Phone:[phone]    LANGUAGE ASSISTANCE:   Language Assistance  Language Resource Used?:  (N/A, spoke with wife Vian)    NEEDS ASSESSMENT:   Is the patient able to participate in the CM Initial Assessment at this time?: No - Patient Unavailable  Information Obtained From: Spouse    Level of Function Prior to Admit: Self Care/Indep. W ADLs    Primary Living Situation: Lives w/Capable Spouse, Lives w/Family         Pre-admission Living Situation: Home/Apartment                  Primary Support Systems: Spouse/significant other    Contact Name: Sanjay Mintzer- wife Phone Number: [phone]   Does the patient have a Family/Support System member participating in Discharge Planning?: Yes    DPOA?: No                  Prior Treatments / Services: None            Who is your PCP?: Dr. Becki Bouton    Do you have your Primary Care Doctor's office number?: Yes    How often do you visit your doctor?: Semi-annual    Do you need information/education regarding your medical condition?: No               Were you hospitalized in the last 30 days?: No            DISCHARGE ASSESSMENT:     Projected Date of Discharge:     Anticipated Complex D/C?: No    Projected Discharge to: Home    Discharge Address: [address] [place] [address]    Projected Discharge Needs: Home Health       Support Identified at Discharge: Spouse          Who is available to transport you upon discharge?: Family Transportation       Christianna Cowman, BSN RN Case Manager  07/23/23

## 2023-07-23 NOTE — Nursing Note
 Depending on how patient does with ambulation, patient may require cardioversion on 5/9 per Dr Becki Bouton. Pt ambulated with occupational therapy and reported fatigue and shortness of breath. Hospitalist informed and Dr Becki Bouton called x2 with VM left. Pt will be NPO at midnight.

## 2023-07-23 NOTE — Progress Notes
 Inpatient Hospitalist Progress Note     PATIENT: Kyle David  MRN: [id number]  DOB: 1960-06-29  DATE OF SERVICE: 07/23/2023    PRIMARY CARE PROVIDER: Demirchyan, Daniel, MD  REFERRING PHYSICIAN: Noelene Gang O.*  ADMITTED ON: 07/22/2023 4:47 PM      Subjective / Events   Stable    Current Inpatient Medications      amLODIPine  10 mg Oral Daily    apixaban  5 mg Oral BID    aspirin  81 mg Oral Daily    atorvastatin  80 mg Oral Daily    magnesium sulfate IV  2 g Intravenous Once    metoprolol succinate  12.5 mg Oral BID    pantoprazole  40 mg IV Push Q24H         Physical Examination   Temp:  [36.3 ?C (97.3 ?F)-37 ?C (98.6 ?F)] 36.3 ?C (97.3 ?F)  Heart Rate:  [65-82] 72  Resp:  [16-21] 18  BP: (112-136)/(69-81) 135/81  NBP Mean:  [89-99] 99  SpO2:  [96 %-99 %] 96 %   UOP: Urine:  [300 mL-400 mL] 300 mL I/Os:   Intake/Output Summary (Last 24 hours) at 07/23/2023 1127  Last data filed at 07/23/2023 0755  Gross per 24 hour   Intake 120 ml   Output 700 ml   Net -580 ml       Gen: well-developed male in no acute distress  HEENT: PERRL, EOMI, OP clear, MMM  Neck: supple, trachea midline, no cervical or supraclavicular lymphadenopathy  CV: irregularly irregular, normal S1S2, no m/r/g  Pulm: CTAB, no wheezes/rales/rhonchi, normal work of breathing  Abd: NABS, soft, flat, NT, no masses or organomegaly  Skin: warm and dry, no rashes  Ext: no cyanosis or edema. Distal pulses 2+ and symmetric  Neuro: AAOx4, CNII-XII intact, motor 5/5 throughout, sensation grossly intact    Labs   Reviewed in electronic medical record. Notable for:    CBC  Recent Labs     07/23/23  0055 07/22/23  1131   WBC 7.73 8.18   HGB 15.2 16.2   HCT 43.1 47.1   MCV 91.7 92.9   PLT 263 274     BMP  Recent Labs     07/22/23  1344 07/22/23  1131   NA 140 137   K 4.4 4.3   CL 109* 107   CO2 29 30   BUN 12 12   CREAT 1.00 1.00   CALCIUM 8.9 9.4   MG 2.0  --      LFT  Recent Labs     07/22/23  1344 07/22/23  1131   TOTPRO 6.2* 6.7   ALBUMIN 3.5 3.7   BILITOT 0.8 1.1*   ALT 37 37   AST 20 18   ALKPHOS 109 120*     Coags  Recent Labs     07/23/23  0055 07/22/23  1131   INR  --  0.9   PT  --  10.3   APTT 34.9 26.2       Imaging   As noted    Assessment   Kyle David is a 63 y.o. male with with a history of hypertension, hyperlipidemia, diabetes (A1c Jan 2025 6.5%), coronary artery disease with myocardial infarction 2009, the 4 vessel coronary artery by pass April 2023, presenting with acute shortness of breath, palpitation and dizziness, found to have new rate controlled atrial fibrillation with CHAD2Vasc score of atldeast 3, labs with Tb 1.1, ALP 120, BNP 329  Recommendations   Symptomatic rate controlled new atrial fibrillation. Presenting with acute shortness of breath, palpitation and dizziness, found to have new rate controlled atrial fibrillation. History with substrate, no clear acute triggers,  with CHAD2Vasc score of atldeast 3, labs with Tb 1.1, ALP 120, BNP 329. TSH 1.6  Telemetry  Cardiology CS, antiarrythmic strategy  Switch lovenox to apixaban  Continue metoprolol 12.5 BID     Chest discomfort. He noted this to be of similar charctar to his heart attck however non exertional, and lesser intensity. ECG with no ischemic changes. hsTnT 10 and 9.  ECG and troponin with chest pain.     Diabetes. Most recent A1c 6.5%, A1c 5.9  ISS     Coronary artery disease. Myocardial infarction 2009, the 4 vessel coronary artery by pass April 2023  Continue aspirin 81mg , atorvastatin 40mg      Hypertension  Continue amlodipine 10mg      Mild bilirubin elevation iTB 1,1. ALP also mildly elevated. No abdominal symptom  RUQUS pending        Prophylaxis: pantoprazole, he is on therapeutic lovenox    Author:  Archer Vise O. Wilkie Harding, MD, MPH  07/23/2023 11:27 AM

## 2023-07-23 NOTE — Progress Notes
 Physical Therapy Missed Visit Note    07/23/2023      Attempted to see patient for physical therapy evaluation. Pt reports he doesn't believe to have any impairments other than SOB with ambulation. He declines PT evaluation at this time. PT respected right to refuse. Rn notified. Will follow up tomorrow to determine if will need PT at this time, possibly to assess vitals with ambulation. Will follow up tomorrow, Rn notified.       Barb Bonito, PT, DPT, NCS, CSRS

## 2023-07-24 ENCOUNTER — Ambulatory Visit: Payer: BLUE CROSS/BLUE SHIELD

## 2023-07-24 LAB — Comprehensive Metabolic Panel: GLUCOSE: 154 mg/dL — ABNORMAL HIGH (ref 74–106)

## 2023-07-24 LAB — Magnesium: MAGNESIUM: 1.6 meq/L (ref 1.5–2.0)

## 2023-07-24 LAB — Glucose,POC
GLUCOSE,POC: 131 mg/dL — ABNORMAL HIGH (ref 65–99)
GLUCOSE,POC: 164 mg/dL — ABNORMAL HIGH (ref 65–99)
GLUCOSE,POC: 157 mg/dL — ABNORMAL HIGH (ref 65–99)
GLUCOSE,POC: 146 mg/dL — ABNORMAL HIGH (ref 65–99)

## 2023-07-24 LAB — Differential Automated: BASOPHIL PERCENT, AUTO: 0.3 % (ref 1.30–3.40)

## 2023-07-24 LAB — CBC: PLATELET COUNT, AUTO: 291 10*3/uL (ref 143–398)

## 2023-07-24 MED ADMIN — METOPROLOL SUCCINATE ER 25 MG PO TB24: 12.5 mg | ORAL | @ 04:00:00 | Stop: 2023-08-22 | NDC 00904632261

## 2023-07-24 MED ADMIN — LIDOCAINE HCL (CARDIAC) 100 MG/5ML IV SOSY: INTRAVENOUS | @ 23:00:00 | Stop: 2023-07-24 | NDC 76329339001

## 2023-07-24 MED ADMIN — PROPOFOL 200 MG/20ML IV EMUL: INTRAVENOUS | @ 23:00:00 | Stop: 2023-07-24 | NDC 63323026929

## 2023-07-24 MED ADMIN — ATORVASTATIN CALCIUM 80 MG PO TABS: 80 mg | ORAL | @ 16:00:00 | Stop: 2023-07-26 | NDC 68084059025

## 2023-07-24 MED ADMIN — AMLODIPINE BESYLATE 10 MG PO TABS: 10 mg | ORAL | @ 16:00:00 | Stop: 2023-08-21 | NDC 60687049611

## 2023-07-24 MED ADMIN — ASPIRIN 81 MG PO TBEC: 81 mg | ORAL | @ 16:00:00 | Stop: 2023-07-26 | NDC 00536123441

## 2023-07-24 MED ADMIN — APIXABAN 5 MG PO TABS: 5 mg | ORAL | @ 04:00:00 | Stop: 2023-07-26 | NDC 00003089431

## 2023-07-24 MED ADMIN — LACTATED RINGERS IV SOLN: 125 mL/h | INTRAVENOUS | @ 21:00:00 | Stop: 2023-07-26 | NDC 00338011704

## 2023-07-24 MED ADMIN — METOPROLOL SUCCINATE ER 25 MG PO TB24: 12.5 mg | ORAL | @ 16:00:00 | Stop: 2023-07-26 | NDC 00904632261

## 2023-07-24 MED ADMIN — APIXABAN 5 MG PO TABS: 5 mg | ORAL | @ 16:00:00 | Stop: 2023-07-26 | NDC 00003089431

## 2023-07-24 MED ADMIN — AMIODARONE HCL 150 MG/3 ML IV SOLN 3 ML VIAL (BULK CHARGE): @ 23:00:00 | Stop: 2023-07-26 | NDC 00143987525

## 2023-07-24 NOTE — Nursing Note
 Pt converted to spontaneous NSR rate 65 during TEE

## 2023-07-24 NOTE — Other
 Patient's Clinical Goal:   Clinical Goal(s) for the Shift: stable vital signs  Identify possible barriers to advancing the care plan: none  Stability of the patient: Moderately Unstable - medium risk of patient condition declining or worsening    Progression of Patient's Clinical Goal: Afib controlled, patient NPO pending TEE and cardioversion.

## 2023-07-24 NOTE — Nursing Note
 TEE PROBE # [id number]

## 2023-07-24 NOTE — H&P
 UPDATED H&P REQUIREMENT    WHAT IS THE STATUS OF THE PATIENT'S MOST CURRENT HISTORY AND PHYSICAL?   - The most current H&P was performed within the past 24 hours. No additional updated H&P documentation is necessary.     REFER TO MEDICAL STAFF POLICIES REGARDING PRE-PROCEDURE HISTORY AND PHYSICAL EXAMINATION AND UPDATED H&P REQUIREMENTS BELOW:    Karle Ovens [facility] Medical Staff Policy 200 - For Patients Undergoing Procedures Requiring Moderate or Deep Sedation, General Anesthesia or Regional Anesthesia    Contents of a History and Physical Examination (H&P):    The H&P shall consist of chief complaint, history of present illness, allergies and medications, relevant social and family history, past medical history, review of systems and physical examination, and assessment and plan appropriate to the patient's age.    For Patients Undergoing Procedures Requiring Moderate or Deep Sedation, General Anesthesia or Regional Anesthesia:    1. An H&P shall be performed within 24 hours prior to the procedure by a qualified member of the medical staff or designee with appropriate privileges, except as noted in item 2 below.    2. If a complete history and physical was performed within thirty (30) calendar days prior to the patient?s admission to the Medical Center for elective surgery, a member of the medical staff assumes the responsibility for the accuracy of the clinical information and will need to document in the medical record within twenty-four (24) hours of admission and prior to surgery or major invasive procedure, that they either attest that the history and physical has been reviewed and accepted, or document an update of the original history and physical relevant to the patient's current clinical status.    3. Providing an H&P for patients undergoing surgery under local anesthesia is at the discretion of the Attending Physician.     4. When a procedure is performed by a dentist, podiatrist or other practitioner who is not privileged to perform an H&P, the anesthesiologist?s assessment immediately prior to the procedure will constitute the 24 hour re-assessment.The dentist, podiatrist or other practitioner who is not privileged to perform an H&P will document the history and physical relevant to the procedure.    5. If the H&P and the written informed consent for the surgery or procedure are not recorded in the patient's medical record prior to surgery, the operation shall not be performed unless the attending physician states in writing that such a delay could lead to an adverse event or irreversible damage to the patient.    6. The above requirements shall not preclude the rendering of emergency medical or surgical care to a patient in dire circumstances.

## 2023-07-24 NOTE — Consults
 Physical Therapy Evaluation      PATIENT: Kyle David  MRN: [id number]  DOB: Sep 29, 1960    ADMIT DATE: 07/22/2023       Date of Evaluation: 07/24/2023    Problems: Active Problems:    * No active hospital problems. *       Past Medical History:   Diagnosis Date    Concussion     age 63 yrs, skill fx     Hyperlipidemia     Hypertension     Myocardial infarction (HCC/RAF) 2009    Past Surgical History:   Procedure Laterality Date    CARDIAC SURGERY  06/2021        Relevant Hospital Course: PT IS A 62 Y/O MALE ADMITTED WITH AFIB, CHEST PRESSURE, SOB. STARTED ON HEAPRIN DRIP.     Patient Stated Goal:       Living Arrangements   Type of Home: House  Home Layout: One level  Bathroom Shower/Tub: Cabin crew: None  Home Equipment: None    Prior Level of Function   Level of Independence: Independent  Lives With: Spouse  Support Available: Family  ADL Assistance: Independent  Vision: Within Functional Limits  Hearing: Within Functional Limits  Transportation: Drives Self    Precautions   Current Activity Order: Activity as tolerated  Weight Bearing Status: Not Applicable    GENERAL EVALUATION   Position: In bed  Lines/devices Drains: HLIV     Bed Mobility   Supine Scooting: Stand by Assist  Rolling: Stand by Assist  Supine to Sit: Stand by Assist  Sit to Supine: Stand by Assist    Functional Mobility   Sit to Stand: Stand by Assist  Ambulation: Stand by Assist  Ambulation Distance (Feet): 150FT+  Gait Pattern: Within Functional Limits  Assistive Device: None     Balance   Sitting - Static: Good  Sitting - Dynamic: Good  Standing - Static: Good      UE Assessment   R UE Assessment: Within functional limits  L UE Assessment: Within functional limits     LE Assessment   RLE Assessment: Within Functional Limits        LLE Assessment: Within Functional Limits         Sensation        Cognition   Cognition: Within Defined Limits  Safety Awareness: Good awareness of safety precautions  Barriers to Learning: None      Pain Assessment   Patient complains of pain: No       Patient Status   Activity Tolerance: Fair  Oxygen Needs: Room Air (FATIGUES QUICKLY)  Response to Treatment: Tolerated treatment well  Call light in reach: Yes  Presentation post treatment: In bed    Interdisciplinary Communication        ASSESSMENT   Rehab Potential: Good  Inpatient Recommendation: PT treatment  Problem List: Decreased gait;Decreased endurance;Decreased activity tolerance  Present During Evaluation: Limitation of activities due to disability (Z73.6)  Treatment Plan: Gait training;Patient and/or family education  Frequency: 3-5 x/week  Duration (days): 30  Progress Note Due Date: 07/31/23    Goals Discussed With: Patient    Short Term Goals to be achieved in: 7 days  Pt will ambulate: 101-150 feet    Long Term Goals to be achieved in: 30 days  Pt will perform supine to sit: independently  Pt will perform sit to stand: independently  Pt will ambulate: > 200 feet;without assistive device;independently    PT Recommendations  Discharge Recommendation: Would benefit from continued therapy  Discharge concerns: Requires assistance for mobility  Discharge Equipment Recommended: None  Comments: RECOMMEND HOMEHEALTH P.T. FOR GAIT TRAINING WITHOUT A.D. AND GENRAL CONDITIONING.  FATIGUES VERY QUICKLY.    AM-PAC   AM-PAC Basic Mobility Raw Score: 24  AM-PAC Basic Mobility t-Scale Score: 57.68  AM-PAC Basic Mobility CMS 0-100% Score: 0 %    Modified Rankin Scale        Evaluation Completed by: Mitchel An, PT,  07/24/2023

## 2023-07-24 NOTE — Progress Notes
 Chief Complaint/Reason for Consult:   Chief Complaint   Patient presents with    Chest Pain     Pt sent here by cardiologist for abnormal EKG. Pt c/o chest pain and sob.      History of Present Illness & Brief Hospital Course:  Mr. Oumar Marcott is a(n) 63 y.o. male with h/o CAD, CABG, s/p AVR, HTN, DM  - came to ER with palpitations and SOB. In ER found in atrial fibrillation with rate controlled    Review of systems: A 14 point review of systems was performed and is noncontributory except as mentioned in history of present illness.    Past Medical History:   Past Medical History:   Diagnosis Date    Concussion     age 89 yrs, skill fx     Hyperlipidemia     Hypertension     Myocardial infarction (HCC/RAF) 2009     Past Surgical History:   Past Surgical History:   Procedure Laterality Date    CARDIAC SURGERY  06/2021     Allergies: No Known Allergies  Current Medications: Scheduled Meds:   amiodarone  200 mg Oral BID    amLODIPine  10 mg Oral Daily    apixaban  5 mg Oral BID    aspirin  81 mg Oral Daily    atorvastatin  80 mg Oral Daily    metoprolol succinate  12.5 mg Oral BID    pantoprazole  40 mg Oral Daily     Continuous Infusions:   lactated ringers 125 mL/hr (07/24/23 1417)     PRN Meds:acetaminophen **OR** acetaminophen suppository, amiodarone, bisacodyl, insulin aspart **AND** dextrose, lidocaine, magnesium hydroxide, senna, temazepam  Family History:   Family History   Adopted: Yes   Problem Relation Age of Onset    Anesthesia problems Neg Hx     Malignant hypertension Neg Hx     Hypotension Neg Hx     Malignant hyperthermia Neg Hx      Social History:   Social History     Tobacco Use    Smoking status: Never     Passive exposure: Never    Smokeless tobacco: Never   Substance Use Topics    Alcohol use: No     Alcohol/week: 0.0 oz    Drug use: No       Physical Examination:  BP 116/70  ~ Pulse 57  ~ Temp 36.2 ?C (97.1 ?F) (Temporal)  ~ Resp 24  ~ Ht 1.829 m (6' 0.01'')  ~ Wt 84 kg (185 lb 3 oz)  ~ SpO2 96% ~ BMI 25.11 kg/m?   General: Alert and oriented. Calm and cooperative. Comfortable. Not in painful or respiratory distress.  HEENT: Normocephalic. Sclera anicteric. Pupils are equal. Oropharynx is clear. Jugular venous pulses not  elevated.  Cardiovascular: Rate is normal. Rhythm is regular. Lungs: Breathing is unlabored. Breath sounds are clear to auscultation.  Abdomen: Soft and non-tender. No distension. No organomegaly appreciated. Bowel sounds present.  Neuro: Alert and oriented x 3. No obvious gross focal deficit. No tremors.  Extremities: Warm to touch. No edema or cyanosis.  Distal pulses: 2+ bilateral radial and dorsalis pedis pulses.    Assessment:  New diagnosed atrial fibrillation  Arrhythmia and tachycardia   CAD/s/p CABG  Ischemic cardiomyopathy   S/p AVR  HTN  DM   Heart failure with preserved EF, acute    Discussion & Recommendations:    Plan to continue same medication  Plan for TEE and  electrical cardioversion of the atrial fibrillation to NSR  Start Eliquis   Check TSH   Replace electrolytes   With walking  - patient with more SOB/DOE     Starlene Consuegra. MD

## 2023-07-24 NOTE — Progress Notes
 Inpatient Hospitalist Progress Note     PATIENT: Kyle David  MRN: [id number]  DOB: 1960/09/16  DATE OF SERVICE: 07/24/2023    PRIMARY CARE PROVIDER: Demirchyan, Daniel, MD  REFERRING PHYSICIAN: Kriste Broman O.*  ADMITTED ON: 07/22/2023 4:47 PM      Subjective / Events   Ongoing symptomatic rate controlled Afib with DOE, fatigue    Current Inpatient Medications      amLODIPine  10 mg Oral Daily    apixaban  5 mg Oral BID    aspirin  81 mg Oral Daily    atorvastatin  80 mg Oral Daily    metoprolol succinate  12.5 mg Oral BID    pantoprazole  40 mg Oral Daily         Physical Examination   Temp:  [36.4 ?C (97.5 ?F)-36.5 ?C (97.7 ?F)] 36.5 ?C (97.7 ?F)  Heart Rate:  [54-70] 65  Resp:  [16-18] 16  BP: (103-119)/(66-76) 114/66  NBP Mean:  [81-89] 82  SpO2:  [96 %-98 %] 96 %   UOP: Urine:  [350 mL] 350 mL I/Os:   Intake/Output Summary (Last 24 hours) at 07/24/2023 1146  Last data filed at 07/24/2023 0400  Gross per 24 hour   Intake 650 ml   Output 350 ml   Net 300 ml       Gen: well-developed male in no acute distress  HEENT: PERRL, EOMI, OP clear, MMM  Neck: supple, trachea midline, no cervical or supraclavicular lymphadenopathy  CV: irregularly irregular, normal S1S2, no m/r/g  Pulm: CTAB, no wheezes/rales/rhonchi, normal work of breathing  Abd: NABS, soft, flat, NT, no masses or organomegaly  Skin: warm and dry, no rashes  Ext: no cyanosis or edema. Distal pulses 2+ and symmetric  Neuro: AAOx4, CNII-XII intact, motor 5/5 throughout, sensation grossly intact    Labs   Reviewed in electronic medical record. Notable for:    CBC  Recent Labs     07/24/23  0420 07/23/23  0055 07/22/23  1131   WBC 7.98 7.73 8.18   HGB 16.0 15.2 16.2   HCT 45.3 43.1 47.1   MCV 92.8 91.7 92.9   PLT 291 263 274     BMP  Recent Labs     07/24/23  0420 07/22/23  1344 07/22/23  1131   NA 141 140 137   K 4.0 4.4 4.3   CL 107* 109* 107   CO2 29 29 30    BUN 23* 12 12   CREAT 1.20 1.00 1.00   CALCIUM 9.5 8.9 9.4   MG 1.6 2.0  --      LFT  Recent Labs 07/24/23  0420 07/22/23  1344 07/22/23  1131   TOTPRO 6.4 6.2* 6.7   ALBUMIN 3.1* 3.5 3.7   BILITOT 0.5 0.8 1.1*   ALT 24 37 37   AST 12* 20 18   ALKPHOS 112 109 120*     Coags  Recent Labs     07/23/23  0055 07/22/23  1131   INR  --  0.9   PT  --  10.3   APTT 34.9 26.2       Imaging   As noted    Assessment   Kyle David is a 63 y.o. male with with a history of hypertension, hyperlipidemia, diabetes (A1c Jan 2025 6.5%), coronary artery disease with myocardial infarction 2009, the 4 vessel coronary artery by pass April 2023, presenting with acute shortness of breath, palpitation and dizziness, found to have new  rate controlled atrial fibrillation with CHAD2Vasc score of atldeast 3, labs with Tb 1.1, ALP 120, BNP 329, Echocardiogram with LVEF 60-65%, mild aortic stenosis, NPO for TEE/DCCV today    Recommendations   Symptomatic rate controlled new atrial fibrillation. Presenting with acute shortness of breath, palpitation and dizziness, found to have new rate controlled atrial fibrillation. History with substrate, no clear acute triggers,  with CHAD2Vasc score of atldeast 3, labs with Tb 1.1, ALP 120, BNP 329. TSH 1.6  Telemetry  Cardiology CS, antiarrythmic strategy  Switch lovenox to apixaban  Continue metoprolol 12.5 BID     Chest discomfort. He noted this to be of similar charctar to his heart attck however non exertional, and lesser intensity. ECG with no ischemic changes. hsTnT 10 and 9.  ECG and troponin with chest pain.     Diabetes. Most recent A1c 6.5%, A1c 5.9  ISS     Coronary artery disease. Myocardial infarction 2009, the 4 vessel coronary artery by pass April 2023  Continue aspirin 81mg , atorvastatin 40mg      Hypertension  Continue amlodipine 10mg      Mild bilirubin elevation iTB 1,1. ALP also mildly elevated. No abdominal symptom  RUQUS pending     Mild aortic stenosis.  Outpatient surveillance     Prophylaxis: pantoprazole, he is on therapeutic lovenox    Author:  Blayze Haen O. Wilkie Harding, MD, MPH  07/24/2023 11:46 AM

## 2023-07-25 LAB — Differential Automated: BASOPHIL PERCENT, AUTO: 0.3 % (ref 0.00–0.04)

## 2023-07-25 LAB — Glucose,POC
GLUCOSE,POC: 120 mg/dL — ABNORMAL HIGH (ref 65–99)
GLUCOSE,POC: 145 mg/dL — ABNORMAL HIGH (ref 65–99)
GLUCOSE,POC: 155 mg/dL — ABNORMAL HIGH (ref 65–99)
GLUCOSE,POC: 253 mg/dL — ABNORMAL HIGH (ref 65–99)

## 2023-07-25 LAB — CBC: HEMOGLOBIN: 15.1 g/dL (ref 13.5–17.1)

## 2023-07-25 LAB — Magnesium: MAGNESIUM: 1.5 meq/L (ref 1.5–2.0)

## 2023-07-25 LAB — Comprehensive Metabolic Panel: TOTAL CO2: 26 mmol/L (ref 21–32)

## 2023-07-25 MED ORDER — APIXABAN 5 MG PO TABS
5 mg | ORAL_TABLET | Freq: Two times a day (BID) | ORAL | 0 refills | 30.00000 days | Status: AC
Start: 2023-07-25 — End: ?

## 2023-07-25 MED ORDER — METOPROLOL SUCCINATE ER 25 MG PO TB24
12.5 mg | ORAL_TABLET | Freq: Two times a day (BID) | ORAL | 0 refills | 30.00000 days | Status: AC
Start: 2023-07-25 — End: ?

## 2023-07-25 MED ORDER — AMIODARONE HCL 200 MG PO TABS
200 mg | ORAL_TABLET | Freq: Two times a day (BID) | ORAL | 0 refills | 67.00000 days | Status: AC
Start: 2023-07-25 — End: ?

## 2023-07-25 MED ADMIN — ASPIRIN 81 MG PO TBEC: 81 mg | ORAL | @ 16:00:00 | Stop: 2023-07-26 | NDC 00536123441

## 2023-07-25 MED ADMIN — APIXABAN 5 MG PO TABS: 5 mg | ORAL | @ 03:00:00 | Stop: 2023-07-26 | NDC 00003089431

## 2023-07-25 MED ADMIN — INSULIN ASPART 100 UNIT/ML IJ SOLN: 10 mL | SUBCUTANEOUS | @ 03:00:00 | Stop: 2023-07-26 | NDC 00169750111

## 2023-07-25 MED ADMIN — METOPROLOL SUCCINATE ER 25 MG PO TB24: 12.5 mg | ORAL | @ 16:00:00 | Stop: 2023-07-26 | NDC 00904632261

## 2023-07-25 MED ADMIN — AMIODARONE HCL 200 MG PO TABS: 200 mg | ORAL | @ 16:00:00 | Stop: 2023-07-26 | NDC 60687043711

## 2023-07-25 MED ADMIN — APIXABAN 5 MG PO TABS: 5 mg | ORAL | @ 16:00:00 | Stop: 2023-07-26 | NDC 00003089431

## 2023-07-25 MED ADMIN — AMIODARONE HCL 200 MG PO TABS: 200 mg | ORAL | @ 03:00:00 | Stop: 2023-07-26 | NDC 60687043711

## 2023-07-25 MED ADMIN — INSULIN ASPART 100 UNIT/ML IJ SOLN: 10 mL | SUBCUTANEOUS | @ 20:00:00 | Stop: 2023-07-26 | NDC 00169750111

## 2023-07-25 MED ADMIN — LACTATED RINGERS IV SOLN: 125 mL/h | INTRAVENOUS | @ 09:00:00 | Stop: 2023-07-26 | NDC 00338011704

## 2023-07-25 MED ADMIN — ATORVASTATIN CALCIUM 80 MG PO TABS: 80 mg | ORAL | @ 16:00:00 | Stop: 2023-07-26 | NDC 00904629304

## 2023-07-25 MED ADMIN — AMLODIPINE BESYLATE 10 MG PO TABS: 10 mg | ORAL | @ 16:00:00 | Stop: 2023-07-26 | NDC 60687049611

## 2023-07-25 MED ADMIN — METOPROLOL SUCCINATE ER 25 MG PO TB24: 12.5 mg | ORAL | @ 03:00:00 | Stop: 2023-07-26 | NDC 00904632261

## 2023-07-25 MED ADMIN — INSULIN ASPART 100 UNIT/ML IJ SOLN: 10 mL | SUBCUTANEOUS | @ 01:00:00 | Stop: 2023-07-26 | NDC 00169750111

## 2023-07-25 MED ADMIN — PANTOPRAZOLE SODIUM 40 MG PO TBEC: 40 mg | ORAL | @ 01:00:00 | Stop: 2023-07-26 | NDC 60687073609

## 2023-07-25 NOTE — Progress Notes
 PChief Complaint/Reason for Consult:   Chief Complaint   Patient presents with    Chest Pain     Pt sent here by cardiologist for abnormal EKG. Pt c/o chest pain and sob.      History of Present Illness & Brief Hospital Course:  [name] is a(n) 63 y.o. male with h/o CAD, CABG, s/p AVR, HTN, DM  - came to ER with palpitations and SOB. In ER found in atrial fibrillation with rate controlled    Review of systems: A 14 point review of systems was performed and is noncontributory except as mentioned in history of present illness.    Past Medical History:   Past Medical History:   Diagnosis Date    Concussion     age 17 yrs, skill fx     Hyperlipidemia     Hypertension     Myocardial infarction (HCC/RAF) 2009     Past Surgical History:   Past Surgical History:   Procedure Laterality Date    CARDIAC SURGERY  06/2021     Allergies: No Known Allergies  Current Medications: Scheduled Meds:   amiodarone  200 mg Oral BID    amLODIPine  10 mg Oral Daily    apixaban  5 mg Oral BID    aspirin  81 mg Oral Daily    atorvastatin  80 mg Oral Daily    metoprolol succinate  12.5 mg Oral BID    pantoprazole  40 mg Oral Daily     Continuous Infusions:   lactated ringers 125 mL/hr (07/25/23 0600)     PRN Meds:acetaminophen **OR** acetaminophen suppository, amiodarone, bisacodyl, insulin aspart **AND** dextrose, lidocaine, magnesium hydroxide, senna, temazepam  Family History:   Family History   Adopted: Yes   Problem Relation Age of Onset    Anesthesia problems Neg Hx     Malignant hypertension Neg Hx     Hypotension Neg Hx     Malignant hyperthermia Neg Hx      Social History:   Social History     Tobacco Use    Smoking status: Never     Passive exposure: Never    Smokeless tobacco: Never   Substance Use Topics    Alcohol use: No     Alcohol/week: 0.0 oz    Drug use: No       Physical Examination:  BP 134/78 (Patient Position: Sitting)  ~ Pulse 61  ~ Temp 36.4 ?C (97.5 ?F) (Oral)  ~ Resp 19  ~ Ht 1.829 m (6' 0.01'')  ~ Wt 84 kg (185 lb 3 oz)  ~ SpO2 96%  ~ BMI 25.11 kg/m?   General: Alert and oriented. Calm and cooperative. Comfortable. Not in painful or respiratory distress.  HEENT: Normocephalic. Sclera anicteric. Pupils are equal. Oropharynx is clear. Jugular venous pulses not  elevated.  Cardiovascular: Rate is normal. Rhythm is regular. Lungs: Breathing is unlabored. Breath sounds are clear to auscultation.  Abdomen: Soft and non-tender. No distension. No organomegaly appreciated. Bowel sounds present.  Neuro: Alert and oriented x 3. No obvious gross focal deficit. No tremors.  Extremities: Warm to touch. No edema or cyanosis.  Distal pulses: 2+ bilateral radial and dorsalis pedis pulses.    Assessment:  New diagnosed atrial fibrillation  Arrhythmia and tachycardia   CAD/s/p CABG  Ischemic cardiomyopathy   S/p AVR  HTN  DM   Heart failure with preserved EF, acute    Discussion & Recommendations:    Plan to continue same medication  Plan for TEE and electrical cardioversion of the atrial fibrillation to NSR  Start Eliquis   Check TSH   Replace electrolytes   With walking  - patient with more SOB/DOE     Kyle David. MD

## 2023-07-25 NOTE — Discharge Summary
 HOSPITALIST DISCHARGE SUMMARY    Name: Kyle David  MRN: [id number]  DOB: 07-05-1960      Admit date: 07/22/2023    Discharge date: 07/25/2023        Admission diagnoses:  Atrial fibrillation (HCC/RAF) [I48.91]  Hx of CABG [Z95.1]  Atrial fibrillation, unspecified type (HCC/RAF) [I48.91]    Discharge diagnoses:  #Afib w RVR  #Chest Pain  #T2DM  #CAD s/p CABGx4  #Essential HTN  #HLD  #Mild Aortic Stenosis    Hospital course:  Kyle David is a 63 y.o. male with with a history of hypertension, hyperlipidemia, diabetes (A1c Jan 2025 6.5%), coronary artery disease with myocardial infarction 2009, the 4 vessel coronary artery by pass April 2023, presenting with acute shortness of breath, palpitation and dizziness, found to have new rate controlled atrial fibrillation with CHAD2Vasc score of atldeast 3, labs with Tb 1.1, ALP 120, BNP 329, Echocardiogram with LVEF 60-65%, mild aortic stenosis, s/p TEE/DCCV back in NSR. Doing well. Cleared for dc by Cardiology. Dc on Eliquis, Amio, Metop, continue rest of home meds. Outpt f/u w PCP & cards. Counsel on med compliance, physician follow up, ER precautions & concerning Sxs.      Symptomatic rate controlled new atrial fibrillation. Presenting with acute shortness of breath, palpitation and dizziness, found to have new rate controlled atrial fibrillation. History with substrate, no clear acute triggers,  with CHAD2Vasc score of atldeast 3, labs with Tb 1.1, ALP 120, BNP 329. TSH 1.6  Telemetry  Cardiology CS, antiarrythmic strategy  Switch lovenox to apixaban  Continue metoprolol 12.5 BID     Chest discomfort. He noted this to be of similar charctar to his heart attck however non exertional, and lesser intensity. ECG with no ischemic changes. hsTnT 10 and 9.  ECG and troponin with chest pain.     Diabetes. Most recent A1c 6.5%, A1c 5.9  ISS     Coronary artery disease. Myocardial infarction 2009, the 4 vessel coronary artery by pass April 2023  Continue aspirin 81mg , atorvastatin 40mg  Hypertension  Continue amlodipine 10mg      Mild bilirubin elevation iTB 1,1. ALP also mildly elevated. No abdominal symptom  RUQUS pending     Mild aortic stenosis.  Outpatient surveillance     Prophylaxis: pantoprazole, he is on therapeutic lovenox      Discharge exam:  Gen: well-developed male in no acute distress  HEENT: PERRL, EOMI, OP clear, MMM  Neck: supple, trachea midline, no cervical or supraclavicular lymphadenopathy  CV: irregularly irregular, normal S1S2, no m/r/g  Pulm: CTAB, no wheezes/rales/rhonchi, normal work of breathing  Abd: NABS, soft, flat, NT, no masses or organomegaly  Skin: warm and dry, no rashes  Ext: no cyanosis or edema. Distal pulses 2+ and symmetric  Neuro: AAOx4, CNII-XII intact, motor 5/5 throughout, sensation grossly intact    Disposition:  HOME    Discharge condition:  STABLE    Patient instructions:  Discharge medications:     Changes To My Medications        START taking these medications        Dose Instructions Notes   amiodarone 200 mg tablet   200 mg   Take 1 tablet (200 mg total) by mouth two (2) times daily.      apixaban 5 mg tablet  Commonly known as: Eliquis   5 mg   Take 1 tablet (5 mg total) by mouth two (2) times daily.             CONTINUE  taking these medications        Dose Instructions Notes   amLODIPine 10 mg tablet  Commonly known as: Norvasc   10 mg   Take 1 tablet (10 mg total) by mouth daily.      aspirin 81 mg EC tablet   81 mg   Take 1 tablet (81 mg total) by mouth daily.      atorvastatin 40 mg tablet  Commonly known as: Lipitor   40 mg   Take 1 tablet (40 mg total) by mouth daily.      magnesium oxide 400 mg tablet   400 mg   Take 1 tablet (400 mg total) by mouth daily.      vitamin B complex capsule   1 capsule   Take 1 capsule by mouth daily.      Vitamin D 50 mcg (2000 units) Caps   50 mcg   Take 1 capsule (50 mcg total) by mouth daily.             STOP taking these medications        STOP taking these medications   metoprolol succinate 25 mg 24 hr tablet  Commonly known as: Toprol-XL                Prescriptions        These medications were sent to [id number] - [place], [place] - [address]  [address], [place] [address]      Phone: [phone]   amiodarone 200 mg tablet  apixaban 5 mg tablet         Future appointments:  Future Appointments   Date Time Provider Department Center   12/18/2023  8:00 AM Demirchyan, Bearl Limes, MD CPN [facility]      PCP  CARDIOLOGY       Greater than 30 minutes was spent organizing discharge materials for the patient, coordinating post-discharge transition of care, and counseling patient on new and old medications to continue      Dr. Alger Anthony, MD  Hospitalist  [facility]

## 2023-07-25 NOTE — Other
 Patient's Clinical Goal:   Clinical Goal(s) for the Shift: normal cardiac rhythm  Identify possible barriers to advancing the care plan: none  Stability of the patient: Moderately Stable - low risk of patient condition declining or worsening   Progression of Patient's Clinical Goal: Patient been NSR overnight , no pain, BG covered.

## 2023-08-14 MED ORDER — ELIQUIS 5 MG PO TABS
5 mg | ORAL_TABLET | Freq: Two times a day (BID) | ORAL | 0 refills
Start: 2023-08-14 — End: ?

## 2023-08-19 MED ORDER — AMIODARONE HCL 200 MG PO TABS
200 mg | ORAL_TABLET | Freq: Two times a day (BID) | ORAL | 0 refills
Start: 2023-08-19 — End: ?

## 2023-09-03 ENCOUNTER — Other Ambulatory Visit: Payer: PRIVATE HEALTH INSURANCE

## 2023-10-14 NOTE — Consults
 [facility]  Outpatient Consult Note    ATTENDING: Rutherford RONAL David   PATIENT: Kyle David  MRN: [id number]  DOB: Jun 16, 1960  DATE OF SERVICE: 10/15/2023  REFERRING PROVIDER: Demirchyan, Daniel, MD  PRIMARY CARE PROVIDER: Demirchyan, Daniel, MD     Ambient Scribe Use Consent: This note was generated using assisted transcription with the verbal consent of the patient/guardian. The note has been reviewed and edited accordingly by me.    Chief Complaint:  Kyle David is a 63 y.o. male who presents for:   Chief Complaint   Patient presents with    New Consult     Consult/ colonoscopy & EGD Colon cancer screening      Subjective   63 y.o. w/ DM2, DL, Mild AS, CAD w/ MI (7990) s/p CABG x 4 (2023), admitted [facility] (May 2025) for symptomatic new afib, s/p TEE/DCCV now in NSR (on Eliquis ). Additionally, he has a history of ''precancerous'' lesions in the stomach, found on an endoscopy at an outside facility, but was lost to follow up.     Unable to find records of prior EGD/Colon, but possibly done at [facility] in 2011.     May 2025: CBC norml, CMP with mild hypoalbuminemia, otherwise unremarkable, TSH normal.     - History of heart attack in 2009, subsequent extreme dietary changes led to anemia  - Upper and lower endoscopies performed between 2009-2011 for anemia; precancerous growth found on endoscopy, but lost to follow up due to insurance/logistics reasons  - Occasional constipation, sometimes lasting a couple of days, generally returns to normal  - No abdominal pain, nausea, vomiting, difficulty swallowing, pain with swallowing, unintentional weight loss, or blood in stools reported  - Occasional darker stools, but no black tarry stools or pencil-thin stools  - No chronic diarrhea  - No chest pain or shortness of breath reported         Past Medical & Surgical History:  Patient Active Problem List   Diagnosis    High blood pressure    Hyperlipemia    History of myocardial infarction Diabetes mellitus (HCC/RAF)    CAD (coronary artery disease), native coronary artery      Past Surgical History:   Procedure Laterality Date    CARDIAC SURGERY  06/2021       Relevant Family History (negative/none if box not checked):    []   Family history of colorectal cancer   []   Family history of GI cancer (not CRC)   []   Family history of IBD   []   Family history of celiac disease  unknown family history, patient adopted    Relevant Social History (negative/none if box not checked):  []   Tobacco   []   Alcohol   []   Recreational Drug Use    MEDICATIONS:     Current Outpatient Medications   Medication Sig    amiodarone  200 mg tablet Take 1 tablet (200 mg total) by mouth two (2) times daily.    amlodipine  10 mg tablet Take 1 tablet (10 mg total) by mouth daily.    aspirin  81 mg EC tablet Take 1 tablet (81 mg total) by mouth daily.    atorvastatin  40 mg tablet Take 1 tablet (40 mg total) by mouth daily.    B Complex Vitamins (VITAMIN B COMPLEX) capsule Take 1 capsule by mouth daily.    Cholecalciferol (VITAMIN D) 50 mcg (2000 units) CAPS Take 1 capsule (50 mcg total) by mouth daily.    ELIQUIS   5 MG tablet Take 1 tablet (5 mg total) by mouth two (2) times daily.    magnesium  oxide 400 mg tablet Take 1 tablet (400 mg total) by mouth daily.    metoprolol  succinate 25 mg 24 hr tablet take 1/2 tablet by mouth twice a day     No current facility-administered medications for this visit.     Allergies :    has no known allergies.      PHYSICAL EXAM      Last Recorded Vital Signs:    10/15/23 0805   BP: 117/68   Pulse: 51   Resp: 18   SpO2: 97%      Body mass index is 25.41 kg/m?[name]  Vitals:    10/15/23 0805   Weight: 187 lb 6.4 oz (85 kg)       System Check if examined and normal Positive or additional negative findings   Constit  [x]  General appearance - normal     Eyes  [x]  Conjuctivae clear       HENMT  []  Normal Lips/teeth/gums, oropharynx, head     Neck  []  Inspection/palpation; thyroid normal      Resp  []  Effort good. Clear to auscultation bilaterally.     CV  []  Normal Rhythm/rate without murmur     Abdomen  [x]  Abdomen soft and NT, no rebound/guarding   [x]  Active BS 4 quadrants   [x]  No appreciable flank or shifting dullness   [x]  No hepatosplenomegaly   []  No umbilical hernia    Rectal   []  No external hemorrhoids   []  No masses palpated on DRE   []  Brown stool in vault   []   Appropriate resting and squeeze pressure   []   Appropriate bear down maneuver      MSK  []  Grossly normal strength, ROM     Neuro   [x]  Grossly normal      Psychiatric  [x]   Oriented to time, place and person   [x]   Appropriate insight/judgement & mood/affect           Lab Review:  The following labs were reviewed today.    Lab Results   Component Value Date    WBC 7.19 07/25/2023    HGB 15.1 07/25/2023    HCT 42.5 07/25/2023    MCV 91.2 07/25/2023    PLT 276 07/25/2023     <!--EPICS-->Chemistry <BR> <BR>       Component                Value               Date/Time           <BR>       NA                       140                 07/25/2023 0444     <BR>       K                        4.1                 07/25/2023 0444     <BR>       CL                       108 (  H)             07/25/2023 0444     <BR>       CO2                      26                  07/25/2023 0444     <BR>       BUN                      21 (H)              07/25/2023 0444       <BR>       Component                Value               Date/Time           <BR>       CALCIUM                   9.3                 07/25/2023 0444     <BR>       ALKPHOS                  105                 07/25/2023 0444     <BR>       AST                      11 (L)              07/25/2023 0444     <BR>       ALT                      21                  07/25/2023 0444     <BR>       BILITOT                  0.7                 07/25/2023 0444     <BR><!--EPICE-->  Lab Results   Component Value Date    TSH 1.6 07/23/2023       Imaging Studies:  The following imaging results were reviewed today.    US  abdomen May 2025  IMPRESSION:  Homogeneous sonographic appearance of the liver with no evidence of diffuse liver disease or biliary dilation.    Endoscopy Studies:  The following imaging results were reviewed today.      ASSESSMENT AND PLAN     63 y.o. w/ DM2, DL, CAD w/ MI (7990) s/p CABG x 4 (2023), admitted [facility] (May 2025) for symptomatic new afib, s/p TEE/DCCV now in NSR (on Eliquis ). Additionally, he has a history of ''precancerous'' lesions in the stomach, found on an endoscopy at an outside facility, but was lost to follow up. Last colonoscopy in about 2010, at the same time as upper endoscopy, both performed in the setting of anemia 2/2 extreme dieting.     # CRC Screening   # c/f Gastric Polyp  # CAD s/p MI (2009), s/p CABG (2023),  and afib s/p DCCV May 2025  - EGD with careful examination   - Colonoscopy for screening.   - Patient has an appointment with Cardiologist on Nov 09, 2023. We will ask for pre-procedure medical optimization, cardiac records, and peri procedural recommendations (is it okay to hold the Eliquis  48 hours prior to the procedure) prior to proceeding with the procedures.   - Patient prefers procedures to be completed in [facility] if possible.     Risks and benefits of endoscopic evaluation were discussed with patient. Patient understands risks including, but not limited to, bleeding requiring blood transfusion, hypotension and hypoxia, missed lesions, perforation, the need for potential emergent surgery resulting in ostomy, or even death. The risks of IV sedation were also discussed including, but not limited to hypotension, hypoxia, bradycardia, cardiorespiratory arrest. Alternatives discussed with patient such as not performing the procedure that can result in delay or failure to to obtain diagnosis. Patient understands and agrees to proceed with the evaluation.     The above recommendation were discussed with the patient. The patient has all questions answered satisfactorily and is in agreement with this recommended plan of care.    Thank you Demirchyan, Toribio, MD for referring Kyle David  and allowing me to participate in your patient's care.   Will send copy of consult note.     Kyle RONAL Poet, MD   [facility] Clinical Instructor  Gastroenterology & Hepatology  Division of Digestive Disease  10/15/2023 8:31 AM     45 minutes were spent personally by me today on this encounter which include today's pre-visit review of the chart, obtaining appropriate history, performing an evaluation, documentation and discussion of management with details supported within the note for today's visit. The time documented was exclusive of any time spent on the separately billed procedure.

## 2023-10-15 ENCOUNTER — Ambulatory Visit: Payer: PRIVATE HEALTH INSURANCE

## 2023-10-15 DIAGNOSIS — Z1211 Encounter for screening for malignant neoplasm of colon: Principal | ICD-10-CM

## 2023-10-15 DIAGNOSIS — I2510 Atherosclerotic heart disease of native coronary artery without angina pectoris: Secondary | ICD-10-CM

## 2023-10-15 DIAGNOSIS — K317 Polyp of stomach and duodenum: Secondary | ICD-10-CM

## 2023-10-15 DIAGNOSIS — I480 Paroxysmal atrial fibrillation: Secondary | ICD-10-CM

## 2023-10-15 NOTE — Patient Instructions
-   Please follow up with your cardiologist for Dr. Mardee (appointment currently scheduled Aug 25) for pre-procedure evaluation (EGD and Colonoscopy with possible intervention), as well as clearance to hold the Eliquis  48 hours prior to the procedures.   - Will order the upper endoscopy and colonoscopy once we have that clearance.   - Complete SuPrep bowel prep  instructions

## 2023-10-28 ENCOUNTER — Ambulatory Visit: Payer: PRIVATE HEALTH INSURANCE

## 2023-10-28 DIAGNOSIS — E119 Type 2 diabetes mellitus without complications: Secondary | ICD-10-CM

## 2023-10-28 DIAGNOSIS — Z0100 Encounter for examination of eyes and vision without abnormal findings: Secondary | ICD-10-CM

## 2023-11-19 ENCOUNTER — Other Ambulatory Visit: Payer: PRIVATE HEALTH INSURANCE

## 2023-12-17 ENCOUNTER — Ambulatory Visit: Payer: PRIVATE HEALTH INSURANCE

## 2023-12-17 DIAGNOSIS — L819 Disorder of pigmentation, unspecified: Secondary | ICD-10-CM

## 2023-12-17 DIAGNOSIS — I480 Paroxysmal atrial fibrillation: Secondary | ICD-10-CM

## 2023-12-17 DIAGNOSIS — I2510 Atherosclerotic heart disease of native coronary artery without angina pectoris: Principal | ICD-10-CM

## 2023-12-17 DIAGNOSIS — I10 Essential (primary) hypertension: Secondary | ICD-10-CM

## 2023-12-17 DIAGNOSIS — Z1159 Encounter for screening for other viral diseases: Secondary | ICD-10-CM

## 2023-12-17 DIAGNOSIS — E782 Mixed hyperlipidemia: Secondary | ICD-10-CM

## 2023-12-17 DIAGNOSIS — Z7901 Long term (current) use of anticoagulants: Secondary | ICD-10-CM

## 2023-12-17 DIAGNOSIS — E119 Type 2 diabetes mellitus without complications: Secondary | ICD-10-CM

## 2023-12-17 NOTE — Progress Notes
 Patient: Kyle David  Date of Birth: Dec 14, 1960  [place] MRN: [id number]  PCP: Lanice Sieving, MD     SUBJECTIVE:       Recent hospitalization for A fib  Spontaneous cardioversion  Sees cardiologist in 1 week    Awaiting colon CA screening    Going to gym regularly      Patient Active Problem List   Diagnosis    High blood pressure    Hyperlipemia    History of myocardial infarction    Diabetes mellitus (HCC/RAF)    CAD (coronary artery disease), native coronary artery     Past Medical History:   Diagnosis Date    Concussion     age 63 yrs, skill fx     Hyperlipidemia     Hypertension     Myocardial infarction (HCC/RAF) 2009     Past Surgical History:   Procedure Laterality Date    CARDIAC SURGERY  06/2021     Outpatient Medications Prior to Visit   Medication Sig Dispense Refill    amiodarone  200 mg tablet Take 1 tablet (200 mg total) by mouth two (2) times daily.      amlodipine  10 mg tablet Take 1 tablet (10 mg total) by mouth daily.      aspirin  81 mg EC tablet Take 1 tablet (81 mg total) by mouth daily.      atorvastatin  40 mg tablet Take 1 tablet (40 mg total) by mouth daily.      B Complex Vitamins (VITAMIN B COMPLEX) capsule Take 1 capsule by mouth daily.      Cholecalciferol (VITAMIN D) 50 mcg (2000 units) CAPS Take 1 capsule (50 mcg total) by mouth daily.      ELIQUIS  5 MG tablet Take 1 tablet (5 mg total) by mouth two (2) times daily.      magnesium  oxide 400 mg tablet Take 1 tablet (400 mg total) by mouth daily.      metoprolol  succinate 25 mg 24 hr tablet take 1/2 tablet by mouth twice a day       No facility-administered medications prior to visit.     No Known Allergies  Family History   Adopted: Yes   Problem Relation Age of Onset    Anesthesia problems Neg Hx     Malignant hypertension Neg Hx     Hypotension Neg Hx     Malignant hyperthermia Neg Hx      Social History     Socioeconomic History    Marital status: Married   Tobacco Use    Smoking status: Never     Passive exposure: Never    Smokeless tobacco: Never   Substance and Sexual Activity    Alcohol use: No     Alcohol/week: 0.0 oz    Drug use: No    Sexual activity: Never   Other Topics Concern    Do you exercise at least a day, 3 or more days a week? Yes    Types of Exercise? (List in Comments) Yes    Do you follow a special diet? Yes    Vegan? No    Vegetarian? No    Pescatarian? No    Lactose Free? No    Gluten Free? No    Omnivore? No       OBJECTIVE:   Vitals Signs  BP 128/72  ~ Pulse 56  ~ Temp 36.6 ?C (97.8 ?F) (Tympanic)  ~ Wt 185 lb (83.9 kg)  ~ SpO2 98%  ~  BMI 25.08 kg/m?  Body mass index is 25.08 kg/m?[name]    Physical Exam  GEN: alert, oriented, no acute distress  HEENT: normocephalic, atraumatic, EOMI, no conjunctival pallor, TMs intact without bulging or erythema, moist mucus membranes, no pharyngeal erythema, no tonsillar exudates, neck supple, no cervical lymphadenopathy  CV: regular rate, no murmurs/gallops/rubs appreciated, 2+ peripheral pulses, capillary refill < 2 seconds  RESP: normal respiratory effort, no wheezes/rales/rhonchi appreciated  GI: normoactive bowel sounds, soft, non-tender, no guarding, no rigidity  MSK: no gross deformities, normal gait  NEURO: CN II-XII grossly intact  SKIN: no rashes or lesions noted  PSYCH: normal affect, appropriate responses, logical, linear, good insight    Diagnostics  Labs:  No visits with results within 1 Day(s) from this visit.   Latest known visit with results is:   No results displayed because visit has over 200 results.          Imaging:    ASSESSMENT & PLAN:

## 2023-12-18 ENCOUNTER — Ambulatory Visit: Payer: PRIVATE HEALTH INSURANCE

## 2024-01-18 ENCOUNTER — Encounter: Payer: Self-pay | Admitting: Radiology

## 2024-03-24 ENCOUNTER — Telehealth: Payer: PRIVATE HEALTH INSURANCE

## 2024-03-24 NOTE — Telephone Encounter
 Message to Practice/Provider      Message: Dr. Ossie Don Cardiologist office calling requesting most recent lab results and MD notes. He has future appointment with them.      Fax. [phone]    Return call is not being requested by the patient or caller.    Patient or caller has been notified that their message will be reviewed within 1-2 business days.

## 2024-03-25 NOTE — Telephone Encounter
Faxed out
